# Patient Record
Sex: Male | Born: 1980 | Race: White | Hispanic: No | Marital: Married | Smoking: Former smoker
Health system: Southern US, Community
[De-identification: ages and names within clinical notes are randomized; demographics above are authoritative.]

## PROBLEM LIST (undated history)

## (undated) DIAGNOSIS — T7840XA Allergy, unspecified, initial encounter: Secondary | ICD-10-CM

## (undated) DIAGNOSIS — I10 Essential (primary) hypertension: Secondary | ICD-10-CM

## (undated) DIAGNOSIS — F319 Bipolar disorder, unspecified: Secondary | ICD-10-CM

## (undated) DIAGNOSIS — F419 Anxiety disorder, unspecified: Secondary | ICD-10-CM

## (undated) DIAGNOSIS — F32A Depression, unspecified: Secondary | ICD-10-CM

## (undated) DIAGNOSIS — K219 Gastro-esophageal reflux disease without esophagitis: Secondary | ICD-10-CM

## (undated) HISTORY — DX: Anxiety disorder, unspecified: F41.9

## (undated) HISTORY — DX: Allergy, unspecified, initial encounter: T78.40XA

## (undated) HISTORY — PX: CHOLECYSTECTOMY: SHX55

## (undated) HISTORY — DX: Depression, unspecified: F32.A

## (undated) HISTORY — DX: Gastro-esophageal reflux disease without esophagitis: K21.9

## (undated) HISTORY — PX: HERNIA REPAIR: SHX51

## (undated) HISTORY — PX: WISDOM TOOTH EXTRACTION: SHX21

## (undated) HISTORY — PX: TONSILLECTOMY: SUR1361

---

## 2010-07-25 ENCOUNTER — Emergency Department (HOSPITAL_BASED_OUTPATIENT_CLINIC_OR_DEPARTMENT_OTHER)
Admission: EM | Admit: 2010-07-25 | Discharge: 2010-07-25 | Payer: Self-pay | Source: Home / Self Care | Admitting: Emergency Medicine

## 2013-09-08 DIAGNOSIS — K8050 Calculus of bile duct without cholangitis or cholecystitis without obstruction: Secondary | ICD-10-CM | POA: Insufficient documentation

## 2014-02-25 DIAGNOSIS — K219 Gastro-esophageal reflux disease without esophagitis: Secondary | ICD-10-CM | POA: Insufficient documentation

## 2016-04-04 ENCOUNTER — Other Ambulatory Visit: Payer: Self-pay | Admitting: Cardiology

## 2019-01-18 DIAGNOSIS — B351 Tinea unguium: Secondary | ICD-10-CM | POA: Insufficient documentation

## 2019-01-18 DIAGNOSIS — E6609 Other obesity due to excess calories: Secondary | ICD-10-CM | POA: Insufficient documentation

## 2019-03-05 DIAGNOSIS — I10 Essential (primary) hypertension: Secondary | ICD-10-CM | POA: Insufficient documentation

## 2019-10-24 ENCOUNTER — Other Ambulatory Visit: Payer: Self-pay

## 2019-10-24 ENCOUNTER — Encounter (HOSPITAL_BASED_OUTPATIENT_CLINIC_OR_DEPARTMENT_OTHER): Payer: Self-pay | Admitting: Student

## 2019-10-24 ENCOUNTER — Emergency Department (HOSPITAL_BASED_OUTPATIENT_CLINIC_OR_DEPARTMENT_OTHER)
Admission: EM | Admit: 2019-10-24 | Discharge: 2019-10-24 | Disposition: A | Discharge status: Home / Self Care | Payer: 59 | Attending: Emergency Medicine | Admitting: Emergency Medicine

## 2019-10-24 DIAGNOSIS — Y93H2 Activity, gardening and landscaping: Secondary | ICD-10-CM | POA: Diagnosis not present

## 2019-10-24 DIAGNOSIS — I10 Essential (primary) hypertension: Secondary | ICD-10-CM | POA: Diagnosis not present

## 2019-10-24 DIAGNOSIS — Y92007 Garden or yard of unspecified non-institutional (private) residence as the place of occurrence of the external cause: Secondary | ICD-10-CM | POA: Insufficient documentation

## 2019-10-24 DIAGNOSIS — S0502XA Injury of conjunctiva and corneal abrasion without foreign body, left eye, initial encounter: Secondary | ICD-10-CM | POA: Insufficient documentation

## 2019-10-24 DIAGNOSIS — X58XXXA Exposure to other specified factors, initial encounter: Secondary | ICD-10-CM | POA: Diagnosis not present

## 2019-10-24 DIAGNOSIS — Y999 Unspecified external cause status: Secondary | ICD-10-CM | POA: Diagnosis not present

## 2019-10-24 DIAGNOSIS — S0592XA Unspecified injury of left eye and orbit, initial encounter: Secondary | ICD-10-CM | POA: Diagnosis present

## 2019-10-24 HISTORY — DX: Essential (primary) hypertension: I10

## 2019-10-24 MED ORDER — TETRACAINE HCL 0.5 % OP SOLN
2.0000 [drp] | Freq: Once | OPHTHALMIC | Status: AC
  Administered 2019-10-24: 15:00:00 2 [drp] via OPHTHALMIC

## 2019-10-24 MED ORDER — ERYTHROMYCIN 5 MG/GM OP OINT: TOPICAL_OINTMENT | OPHTHALMIC | 0 refills | Status: DC

## 2019-10-24 MED ORDER — FLUORESCEIN SODIUM 1 MG OP STRP
1.0000 | ORAL_STRIP | Freq: Once | OPHTHALMIC | Status: AC
  Administered 2019-10-24: 1 via OPHTHALMIC

## 2019-10-24 MED FILL — ERYTHROMYCIN EYE OINTMENT: 5 | 7 days supply | Qty: 4 | Fill #0

## 2019-10-24 NOTE — Discharge Instructions (Addendum)
You were seen in the ER today for problems with your left eye and found to have a corneal abrasion (please see attached handout). We are sending you home with erythromycin ointment (an antibiotic) to help cover for infection. Please use in the left eye 5 times per day for the next 7 days. Please follow up with ophthalmology within 2-3 days for re-evaluation. Return to the ER for new or worsening symptoms including but not limited to increased pain, blurry vision, loss of vision, redness/swelling around the eye, fever, pain with moving the eye, or any other concerns. Please also follow up with primary care within 1 month for recheck of your blood pressure as it was elevated in the ER today at 158/95.

## 2019-10-24 NOTE — ED Provider Notes (Signed)
[facility] EMERGENCY DEPARTMENT Provider Note   CSN: [id number] Arrival date & time: 10/24/19  1426     History Chief Complaint  Patient presents with  . Eye Pain    Jose Ramos is a 39 y.o. male with a history of hypertension who presents to the ED with complaints of L eye irritation. Patient states that 2 days prior he was doing some yardwork outside when he felt something fly into his left eye, he thinks this may have been grass, he blinked a few times and this seemed to resolve the problem. The subsequent day he noted some L eye redness and some crusting in the AM. Having some mild discomfort intermittently to the superolateral aspect of the L eye. No alleviating/aggravating factors. Not a contact lens wearer. Denies visual disturbance, eye swelling, fever, chills, nausea, or vomiting. Last tetanus 3-4 years ago.    HPI     Past Medical History:  Diagnosis Date  . Hypertension     There are no problems to display for this patient.   History reviewed. No pertinent surgical history.     History reviewed. No pertinent family history.  Social History   Tobacco Use  . Smoking status: Never Smoker  . Smokeless tobacco: Never Used  Substance Use Topics  . Alcohol use: Yes  . Drug use: Never    Home Medications Prior to Admission medications   Medication Sig Start Date End Date Taking? Authorizing Provider  erythromycin ophthalmic ointment Place a 1/2 inch ribbon of ointment into the lower left eyelid 5 times per day for the next 7 days. 10/24/19   Scarlett Portlock, Pleas Koch, PA-C    Allergies    Patient has no allergy information on record.  Review of Systems   Review of Systems  Constitutional: Negative for chills and fever.  HENT: Negative for congestion, ear pain and sore throat.   Eyes: Positive for pain, discharge and redness. Negative for photophobia, itching and visual disturbance.  Respiratory: Negative for shortness of breath.     Cardiovascular: Negative for chest pain.  Gastrointestinal: Negative for nausea and vomiting.    Physical Exam Updated Vital Signs BP (!) 158/95 (BP Location: Left Arm)   Pulse (!) 104   Temp 98.8 F (37.1 C) (Oral)   Resp 20   Ht 5\' 10"  (1.778 m)   Wt 106.6 kg   SpO2 100%   BMI 33.72 kg/m   Physical Exam Vitals and nursing note reviewed.  Constitutional:      General: He is not in acute distress.    Appearance: He is well-developed.  HENT:     Head: Normocephalic and atraumatic.     Nose: Nose normal.  Eyes:     General: Lids are normal. Lids are everted, no foreign bodies appreciated. Vision grossly intact. Gaze aligned appropriately.        Right eye: No discharge.        Left eye: No discharge.     Extraocular Movements: Extraocular movements intact.     Conjunctiva/sclera:     Right eye: Right conjunctiva is not injected. No chemosis, exudate or hemorrhage.    Left eye: Left conjunctiva is injected (lateral aspect ). No chemosis, exudate or hemorrhage.    Pupils: Pupils are equal, round, and reactive to light.     Comments: No periorbital erythema swelling or tenderness.  Visual acuity: Visual Acuity Bilateral Distance:20/20 R Distance:20/20 L Distance:20/25 2 mm area of fluorescein stain uptake and the lateral aspect of  the L eye consistent w/ corneal abrasion, no ulceration, no dendritic stain, negative seidel sign.   Neurological:     Mental Status: He is alert.     Comments: Clear speech.   Psychiatric:        Behavior: Behavior normal.        Thought Content: Thought content normal.     ED Results / Procedures / Treatments   Labs (all labs ordered are listed, but only abnormal results are displayed) Labs Reviewed - No data to display  EKG None  Radiology No results found.  Procedures Procedures (including critical care time)  Medications Ordered in ED Medications  fluorescein ophthalmic strip 1 strip (1 strip Left Eye Given 10/24/19 1441)   tetracaine (PONTOCAINE) 0.5 % ophthalmic solution 2 drop (2 drops Left Eye Given 10/24/19 1441)    ED Course  I have reviewed the triage vital signs and the nursing notes.  Pertinent labs & imaging results that were available during my care of the patient were reviewed by me and considered in my medical decision making (see chart for details).    MDM Rules/Calculators/A&P                      Patient presents to the ED with complaints of left eye irritation. Nontoxic, resting comfortably, vitals notable for elevated BP- history of same- doubt HTN emergency. Exam with mild conjunctival injection laterally with small lateral corneal abrasion noted. No ulceration noted or dendritic staining. No signs of globe rupture. Patient is afebrile and without proptosis, entrapment, or consensual photophobia, no periorbital swelling/erythema- doubt periorbital or orbital cellulitis. No significant visual acuity deficit. Tetanus is up to date. Will treat with erythromycin ophthalmic ointment as patient is not a contact lens wearer, ophthalmology follow up. I discussed treatment plan, need for follow-up, and return precautions with the patient. Provided opportunity for questions, patient confirmed understanding and is in agreement with plan. Findings and plan of care discussed with supervising physician Dr. Ronnald Nian who is in agreement.    Final Clinical Impression(s) / ED Diagnoses Final diagnoses:  Abrasion of left cornea, initial encounter    Rx / DC Orders ED Discharge Orders         Ordered    erythromycin ophthalmic ointment     10/24/19 [address], [place], PA-C 10/24/19 [place], [place], DO 10/25/19 1610

## 2019-10-24 NOTE — ED Triage Notes (Signed)
approx 2 days ago was doing yard work, something got into left eye. Eye now red, having HA

## 2021-01-21 DIAGNOSIS — F331 Major depressive disorder, recurrent, moderate: Secondary | ICD-10-CM | POA: Insufficient documentation

## 2021-03-01 DIAGNOSIS — F410 Panic disorder [episodic paroxysmal anxiety] without agoraphobia: Secondary | ICD-10-CM | POA: Insufficient documentation

## 2021-03-01 DIAGNOSIS — F332 Major depressive disorder, recurrent severe without psychotic features: Secondary | ICD-10-CM | POA: Insufficient documentation

## 2021-03-28 DIAGNOSIS — F3162 Bipolar disorder, current episode mixed, moderate: Secondary | ICD-10-CM | POA: Insufficient documentation

## 2021-03-30 DIAGNOSIS — Z91148 Patient's other noncompliance with medication regimen for other reason: Secondary | ICD-10-CM | POA: Insufficient documentation

## 2021-03-30 DIAGNOSIS — F99 Mental disorder, not otherwise specified: Secondary | ICD-10-CM | POA: Insufficient documentation

## 2021-03-30 DIAGNOSIS — F5105 Insomnia due to other mental disorder: Secondary | ICD-10-CM | POA: Insufficient documentation

## 2021-08-16 ENCOUNTER — Emergency Department (HOSPITAL_BASED_OUTPATIENT_CLINIC_OR_DEPARTMENT_OTHER): Payer: Commercial Managed Care - PPO

## 2021-08-16 ENCOUNTER — Encounter (HOSPITAL_BASED_OUTPATIENT_CLINIC_OR_DEPARTMENT_OTHER): Payer: Self-pay

## 2021-08-16 ENCOUNTER — Other Ambulatory Visit: Payer: Self-pay

## 2021-08-16 ENCOUNTER — Emergency Department (HOSPITAL_BASED_OUTPATIENT_CLINIC_OR_DEPARTMENT_OTHER)
Admission: EM | Admit: 2021-08-16 | Discharge: 2021-08-16 | Disposition: A | Discharge status: Home / Self Care | Payer: Commercial Managed Care - PPO | Attending: Emergency Medicine | Admitting: Emergency Medicine

## 2021-08-16 DIAGNOSIS — K5732 Diverticulitis of large intestine without perforation or abscess without bleeding: Secondary | ICD-10-CM | POA: Insufficient documentation

## 2021-08-16 DIAGNOSIS — K922 Gastrointestinal hemorrhage, unspecified: Secondary | ICD-10-CM | POA: Diagnosis not present

## 2021-08-16 DIAGNOSIS — R1031 Right lower quadrant pain: Secondary | ICD-10-CM

## 2021-08-16 DIAGNOSIS — I10 Essential (primary) hypertension: Secondary | ICD-10-CM | POA: Insufficient documentation

## 2021-08-16 DIAGNOSIS — K219 Gastro-esophageal reflux disease without esophagitis: Secondary | ICD-10-CM | POA: Diagnosis not present

## 2021-08-16 DIAGNOSIS — K5730 Diverticulosis of large intestine without perforation or abscess without bleeding: Secondary | ICD-10-CM

## 2021-08-16 HISTORY — DX: Bipolar disorder, unspecified: F31.9

## 2021-08-16 LAB — URINALYSIS, ROUTINE W REFLEX MICROSCOPIC
Bilirubin Urine: NEGATIVE
Glucose, UA: NEGATIVE mg/dL
Hgb urine dipstick: NEGATIVE
Ketones, ur: NEGATIVE mg/dL
Leukocytes,Ua: NEGATIVE
Nitrite: NEGATIVE
Protein, ur: NEGATIVE mg/dL
Specific Gravity, Urine: 1.025 (ref 1.005–1.030)
pH: 6 (ref 5.0–8.0)

## 2021-08-16 LAB — CBC WITH DIFFERENTIAL/PLATELET
Abs Immature Granulocytes: 0.02 10*3/uL (ref 0.00–0.07)
Basophils Absolute: 0.1 10*3/uL (ref 0.0–0.1)
Basophils Relative: 1 %
Eosinophils Absolute: 0.4 10*3/uL (ref 0.0–0.5)
Eosinophils Relative: 7 %
HCT: 48.1 % (ref 39.0–52.0)
Hemoglobin: 16.5 g/dL (ref 13.0–17.0)
Immature Granulocytes: 0 %
Lymphocytes Relative: 27 %
Lymphs Abs: 1.7 10*3/uL (ref 0.7–4.0)
MCH: 28.2 pg (ref 26.0–34.0)
MCHC: 34.3 g/dL (ref 30.0–36.0)
MCV: 82.2 fL (ref 80.0–100.0)
Monocytes Absolute: 0.4 10*3/uL (ref 0.1–1.0)
Monocytes Relative: 7 %
Neutro Abs: 3.6 10*3/uL (ref 1.7–7.7)
Neutrophils Relative %: 58 %
Platelets: 236 10*3/uL (ref 150–400)
RBC: 5.85 MIL/uL — ABNORMAL HIGH (ref 4.22–5.81)
RDW: 13.6 % (ref 11.5–15.5)
WBC: 6.3 10*3/uL (ref 4.0–10.5)
nRBC: 0 % (ref 0.0–0.2)

## 2021-08-16 LAB — COMPREHENSIVE METABOLIC PANEL
ALT: 19 U/L (ref 0–44)
AST: 16 U/L (ref 15–41)
Albumin: 4.3 g/dL (ref 3.5–5.0)
Alkaline Phosphatase: 51 U/L (ref 38–126)
Anion gap: 8 (ref 5–15)
BUN: 18 mg/dL (ref 6–20)
CO2: 25 mmol/L (ref 22–32)
Calcium: 9.1 mg/dL (ref 8.9–10.3)
Chloride: 105 mmol/L (ref 98–111)
Creatinine, Ser: 1.3 mg/dL — ABNORMAL HIGH (ref 0.61–1.24)
GFR, Estimated: >60 mL/min (ref >60)
Glucose, Bld: 101 mg/dL — ABNORMAL HIGH (ref 70–99)
Potassium: 4.3 mmol/L (ref 3.5–5.1)
Sodium: 138 mmol/L (ref 135–145)
Total Bilirubin: 1.1 mg/dL (ref 0.3–1.2)
Total Protein: 7.5 g/dL (ref 6.5–8.1)

## 2021-08-16 LAB — OCCULT BLOOD X 1 CARD TO LAB, STOOL: Fecal Occult Bld: POSITIVE — AB

## 2021-08-16 LAB — LIPASE, BLOOD: Lipase: 141 U/L — ABNORMAL HIGH (ref 11–51)

## 2021-08-16 MED ORDER — SODIUM CHLORIDE 0.9 % IV BOLUS
1000.0000 mL | Freq: Once | INTRAVENOUS | Status: AC
  Administered 2021-08-16: 1000 mL via INTRAVENOUS

## 2021-08-16 MED ORDER — HYDROCORTISONE (PERIANAL) 2.5 % EX CREA: 1.0000 "application " | TOPICAL_CREAM | Freq: Two times a day (BID) | CUTANEOUS | 0 refills | Status: DC

## 2021-08-16 MED ORDER — IOHEXOL 350 MG/ML SOLN
100.0000 mL | Freq: Once | INTRAVENOUS | Status: AC | PRN
  Administered 2021-08-16: 100 mL via INTRAVENOUS

## 2021-08-16 NOTE — ED Triage Notes (Addendum)
Pt reports re blood in stool this morning x 2. Right sided lower abdominal pain. Occurred x 1 2 weeks ago and pt reports an Increase in reflux

## 2021-08-16 NOTE — ED Provider Notes (Signed)
[facility] Provider Note   CSN: [id number] Arrival date & time: 08/16/21  [id number]     History  Chief Complaint  Patient presents with   Blood In Stools    Jose Ramos is a 41 y.o. male.  Pt is a 41 yo wm with a hx of GERD, htn, and bipolar d/o.  Pt said he woke up this am and had "what looks like a miscarriage in his toilet."  He said he had 2 episodes of bloody stools this am.  Pt had some bloody stool a few weeks ago and saw his pcp.  He said they were not sure what it was from, but it looks like a CT abd/pelvis was ordered, but he did not get it done.  Pt has some associated RLQ pain which he's had for about a month intermittently.  He denies constipation.  He said he has several bowel movements a day since he had his gallbladder removed a few years ago.  Pt denies any pain with bm.      Home Medications Prior to Admission medications   Medication Sig Start Date End Date Taking? Authorizing Provider  hydrocortisone (ANUSOL-HC) 2.5 % rectal cream Place 1 application rectally 2 (two) times daily. 08/16/21  Yes Isla Pence, MD  erythromycin ophthalmic ointment Place a 1/2 inch ribbon of ointment into the lower left eyelid 5 times per day for the next 7 days. 10/24/19   Petrucelli, Glynda Jaeger, PA-C      Allergies    Patient has no known allergies.    Review of Systems   Review of Systems  Gastrointestinal:  Positive for abdominal pain and blood in stool.  All other systems reviewed and are negative.  Physical Exam Updated Vital Signs BP (!) 150/101    Pulse (!) 56    Temp 98.3 F (36.8 C)    Resp 18    Ht 5\' 9"  (1.753 m)    Wt 108.9 kg    SpO2 100%    BMI 35.44 kg/m  Physical Exam Vitals and nursing note reviewed. Exam conducted with a chaperone present.  Constitutional:      Appearance: Normal appearance.  HENT:     Head: Normocephalic and atraumatic.     Right Ear: External ear normal.     Left Ear: External ear normal.     Nose:  Nose normal.     Mouth/Throat:     Mouth: Mucous membranes are moist.     Pharynx: Oropharynx is clear.  Eyes:     Extraocular Movements: Extraocular movements intact.     Conjunctiva/sclera: Conjunctivae normal.     Pupils: Pupils are equal, round, and reactive to light.  Cardiovascular:     Rate and Rhythm: Normal rate and regular rhythm.     Pulses: Normal pulses.     Heart sounds: Normal heart sounds.  Pulmonary:     Effort: Pulmonary effort is normal.     Breath sounds: Normal breath sounds.  Abdominal:     General: Abdomen is flat. Bowel sounds are normal.     Palpations: Abdomen is soft.     Tenderness: There is abdominal tenderness in the right lower quadrant.  Genitourinary:    Rectum: Guaiac result positive. No external hemorrhoid.  Musculoskeletal:        General: Normal range of motion.     Cervical back: Normal range of motion and neck supple.  Skin:    General: Skin is warm.  Capillary Refill: Capillary refill takes less than 2 seconds.  Neurological:     General: No focal deficit present.     Mental Status: He is alert and oriented to person, place, and time.  Psychiatric:        Mood and Affect: Mood normal.        Behavior: Behavior normal.        Thought Content: Thought content normal.        Judgment: Judgment normal.    ED Results / Procedures / Treatments   Labs (all labs ordered are listed, but only abnormal results are displayed) Labs Reviewed  CBC WITH DIFFERENTIAL/PLATELET - Abnormal; Notable for the following components:      Result Value   RBC 5.85 (*)    All other components within normal limits  COMPREHENSIVE METABOLIC PANEL - Abnormal; Notable for the following components:   Glucose, Bld 101 (*)    Creatinine, Ser 1.30 (*)    All other components within normal limits  LIPASE, BLOOD - Abnormal; Notable for the following components:   Lipase 141 (*)    All other components within normal limits  OCCULT BLOOD X 1 CARD TO LAB, STOOL -  Abnormal; Notable for the following components:   Fecal Occult Bld POSITIVE (*)    All other components within normal limits  URINALYSIS, ROUTINE W REFLEX MICROSCOPIC    EKG None  Radiology CT Angio Abd/Pel W and/or Wo Contrast  Result Date: 08/16/2021 CLINICAL DATA:  Lower GI bleeding, right lower quadrant pain EXAM: CTA ABDOMEN AND PELVIS WITHOUT AND WITH CONTRAST TECHNIQUE: Multidetector CT imaging of the abdomen and pelvis was performed using the standard protocol during bolus administration of intravenous contrast. Multiplanar reconstructed images and MIPs were obtained and reviewed to evaluate the vascular anatomy. RADIATION DOSE REDUCTION: This exam was performed according to the departmental dose-optimization program which includes automated exposure control, adjustment of the mA and/or kV according to patient size and/or use of iterative reconstruction technique. CONTRAST:  132mL OMNIPAQUE IOHEXOL 350 MG/ML SOLN COMPARISON:  None. FINDINGS: VASCULAR Aorta: Normal caliber aorta without aneurysm, dissection, vasculitis or significant stenosis. Celiac: Patent without evidence of aneurysm, dissection, vasculitis or significant stenosis. SMA: Patent without evidence of aneurysm, dissection, vasculitis or significant stenosis. Renals: Duplicated right, superior dominant, both patent. Duplicated left, Co-dominant, both patent. IMA: Patent without evidence of aneurysm, dissection, vasculitis or significant stenosis. Inflow: Patent without evidence of aneurysm, dissection, vasculitis or significant stenosis. Proximal Outflow: Bilateral common femoral and visualized portions of the superficial and profunda femoral arteries are patent without evidence of aneurysm, dissection, vasculitis or significant stenosis. Veins: No obvious venous abnormality within the limitations of this arterial phase study. Review of the MIP images confirms the above findings. NON-VASCULAR Lower chest: No pleural or pericardial  effusion. Visualized lung bases clear. Hepatobiliary: No focal liver abnormality is seen. Status post cholecystectomy. No biliary dilatation. Pancreas: Unremarkable. No pancreatic ductal dilatation or surrounding inflammatory changes. Spleen: Normal in size without focal abnormality. Adrenals/Urinary Tract: Adrenal glands are unremarkable. Kidneys are normal, without renal calculi, focal lesion, or hydronephrosis. Bladder is unremarkable. Stomach/Bowel: No active extravasation into the lumen identified. Stomach is nondistended, unremarkable. Small bowel decompressed. Normal appendix. The colon is nondilated. A few scattered diverticula from the splenic flexure and proximal descending segment, without adjacent inflammatory change. Lymphatic: No abdominal or pelvic adenopathy. Reproductive: Prostate is unremarkable. Other: No ascites.  No free air. Musculoskeletal: Early degenerative disc disease L5-S1. No fracture or worrisome bone lesion. IMPRESSION: 1. No  active extravasation into the bowel lumen. 2. Diverticulosis involving splenic flexure and proximal descending colon. Electronically Signed   By: Lucrezia Europe M.D.   On: 08/16/2021 10:33    Procedures Procedures    Medications Ordered in ED Medications  sodium chloride 0.9 % bolus 1,000 mL (0 mLs Intravenous Stopped 08/16/21 0929)  iohexol (OMNIPAQUE) 350 MG/ML injection 100 mL (100 mLs Intravenous Contrast Given 08/16/21 0900)    ED Course/ Medical Decision Making/ A&P                           Medical Decision Making Amount and/or Complexity of Data Reviewed Labs: ordered. Radiology: ordered.  Risk Prescription drug management.   Pt does have guaiac + stool.  Due to the amount of blood described, pt had labs and a CTA done.  Labs show nothing acute.  Hgb is 16.5.  CTA shows some diverticulosis, but no active extravasation into the bowel.  Bleeding may be due to an internal hemorrhoid.  Pt started on anusol hc and referred to GI.  Pt  eats a very low fiber diet.  He is encouraged to increase the fiber and fruits/vegetables in his diet.  Pt has not had any further bleeding here.  He is not on blood thinners.  He is stable for d/c.  Return if worse.  F/u with pcp.  Plan d/w pt and his wife at bedside and they are in agreement.         Final Clinical Impression(s) / ED Diagnoses Final diagnoses:  Lower GI bleed  Right lower quadrant abdominal pain  Diverticulosis of colon without hemorrhage    Rx / DC Orders ED Discharge Orders          Ordered    Ambulatory referral to Gastroenterology        08/16/21 1103    hydrocortisone (ANUSOL-HC) 2.5 % rectal cream  2 times daily        08/16/21 1103              Isla Pence, MD 08/16/21 1109

## 2021-09-13 ENCOUNTER — Encounter: Payer: Self-pay | Admitting: Emergency Medicine

## 2021-10-27 ENCOUNTER — Encounter: Payer: Self-pay | Admitting: Gastroenterology

## 2021-11-05 ENCOUNTER — Encounter: Payer: Self-pay | Admitting: Gastroenterology

## 2021-11-05 ENCOUNTER — Ambulatory Visit (INDEPENDENT_AMBULATORY_CARE_PROVIDER_SITE_OTHER): Payer: Commercial Managed Care - PPO | Admitting: Gastroenterology

## 2021-11-05 VITALS — BP 116/74 | HR 80 | Ht 69.0 in | Wt 244.8 lb

## 2021-11-05 DIAGNOSIS — K921 Melena: Secondary | ICD-10-CM | POA: Diagnosis not present

## 2021-11-05 DIAGNOSIS — R112 Nausea with vomiting, unspecified: Secondary | ICD-10-CM | POA: Diagnosis not present

## 2021-11-05 DIAGNOSIS — R634 Abnormal weight loss: Secondary | ICD-10-CM | POA: Diagnosis not present

## 2021-11-05 DIAGNOSIS — R1030 Lower abdominal pain, unspecified: Secondary | ICD-10-CM | POA: Diagnosis not present

## 2021-11-05 MED ORDER — ONDANSETRON HCL 4 MG PO TABS: 4.0000 mg | ORAL_TABLET | Freq: Four times a day (QID) | ORAL | 0 refills | Status: AC | PRN

## 2021-11-05 NOTE — Patient Instructions (Signed)
You have been scheduled for an endoscopy. Please follow written instructions given to you at your visit today. ?If you use inhalers (even only as needed), please bring them with you on the day of your procedure. ? ?We have sent the following medications to your pharmacy for you to pick up at your convenience: ?Zofran 4 mg every 6 hours as needed for nausea/vomiting ? ?If you are age 42 or older, your body mass index should be between 23-30. Your Body mass index is 36.15 kg/m?Marland Kitchen If this is out of the aforementioned range listed, please consider follow up with your Primary Care Provider. ? ?If you are age 31 or younger, your body mass index should be between 19-25. Your Body mass index is 36.15 kg/m?Marland Kitchen If this is out of the aformentioned range listed, please consider follow up with your Primary Care Provider.  ? ?________________________________________________________ ? ?The [place] [facility] providers would like to encourage you to use [facility] to communicate with providers for non-urgent requests or questions.  Due to long hold times on the telephone, sending your provider a message by [facility] may be a faster and more efficient way to get a response.  Please allow 48 business hours for a response.  Please remember that this is for non-urgent requests.  ?_______________________________________________________ ? ?Due to recent changes in healthcare laws, you may see the results of your imaging and laboratory studies on MyChart before your provider has had a chance to review them.  We understand that in some cases there may be results that are confusing or concerning to you. Not all laboratory results come back in the same time frame and the provider may be waiting for multiple results in order to interpret others.  Please give [place] 48 hours in order for your provider to thoroughly review all the results before contacting the office for clarification of your results.  ? ?

## 2021-11-05 NOTE — Progress Notes (Signed)
? ? ?[facility] Consult Note: ? ?History: ?Jose Ramos ?11/05/2021 ? ?Referring provider: Sherrine Maples, MD ? ?Reason for consult/chief complaint: Diverticulitis [facility] visit. ) and Emesis (Unable to keep food down, vomiting about 60% of the time. Admits to weight loss) ? ? ?Subjective  ?HPI: ? ?This is a very pleasant 41 year old man referred by primary care and recent ED physicians for multiple digestive symptoms ? ?He was seen in the [place] ED on January 23 with hematochezia that may have been diverticular in origin.  CTA showed diverticulosis and no extravasation. ? ?He was seen at the [place] ED in [facility] on April 6 with nausea vomiting abdominal pain as high as an 8 out of 10 with difficulty maintaining his oral intake. ? ?[name] tells me that for many years he has had reflux symptoms remittent dysphagia and might have previously seen a GI physician.  He tried multiple PPIs and his current one seems to work the best.  However he still has breakthrough symptoms even just drinking water at times.  He has primarily pyrosis and regurgitation with belching.  He is feeling much worse in the last 2 weeks with increased belching and vomiting and only able to keep down about half of what he eats and drinks.  He was having lower abdominal pain during the ED visit last week but says that is improved.  There have been no more bleeding episodes since the 1 that brought him to the ED in January.  He has been down about 10 pounds in the last month due to decreased oral intake. ? ?ROS: ? ?Review of Systems  ?Constitutional:  Positive for unexpected weight change. Negative for appetite change.  ?HENT:  Negative for mouth sores and voice change.   ?Eyes:  Negative for pain and redness.  ?Respiratory:  Negative for cough and shortness of breath.   ?Cardiovascular:  Negative for chest pain and palpitations.  ?Genitourinary:  Negative for dysuria and  hematuria.  ?Musculoskeletal:  Negative for arthralgias and myalgias.  ?Skin:  Negative for pallor and rash.  ?Neurological:  Negative for weakness and headaches.  ?Hematological:  Negative for adenopathy.  ?Psychiatric/Behavioral:    ?     Mood lately stable.  ? ? ?Past Medical History: ?Past Medical History:  ?Diagnosis Date  ? Bipolar 1 disorder ([place])   ? Hypertension   ? ? ? ?Past Surgical History: ?Past Surgical History:  ?Procedure Laterality Date  ? CHOLECYSTECTOMY    ? HERNIA REPAIR    ? TONSILLECTOMY    ? WISDOM TOOTH EXTRACTION    ? ? ? ?Family History: ?Family History  ?Problem Relation Age of Onset  ? Kidney disease Mother   ? Hypertension Mother   ? GER disease Mother   ? Clotting disorder Mother   ? Colon cancer Neg Hx   ? Colon polyps Neg Hx   ? ? ?Social History: ?Social History  ? ?Socioeconomic History  ? Marital status: Married  ?  Spouse name: Not on file  ? Number of children: Not on file  ? Years of education: Not on file  ? Highest education level: Not on file  ?Occupational History  ? Not on file  ?Tobacco Use  ? Smoking status: Former  ?  Types: Cigarettes  ? Smokeless tobacco: Never  ?Vaping Use  ? Vaping Use: Some days  ?Substance and Sexual Activity  ? Alcohol use: Yes  ?  Comment: social  ? Drug use: Never  ?  Sexual activity: Yes  ?Other Topics Concern  ? Not on file  ?Social History Narrative  ? Not on file  ? ?Social Determinants of Health  ? ?Financial Resource Strain: Not on file  ?Food Insecurity: Not on file  ?Transportation Needs: Not on file  ?Physical Activity: Not on file  ?Stress: Not on file  ?Social Connections: Not on file  ? ? ?Allergies: ?Allergies  ?Allergen Reactions  ? Lisinopril Swelling  ?  Other reaction(s): Angioedema (ALLERGY/intolerance)  ? ? ?Outpatient Meds: ?Current Outpatient Medications  ?Medication Sig Dispense Refill  ? citalopram (CELEXA) 40 MG tablet Take 40 mg by mouth daily.    ? OLANZapine (ZYPREXA) 7.5 MG tablet Take 7.5 mg by mouth at bedtime.    ?  ondansetron (ZOFRAN) 4 MG tablet Take 1 tablet (4 mg total) by mouth every 6 (six) hours as needed for nausea or vomiting. 60 tablet 0  ? pantoprazole (PROTONIX) 20 MG tablet Take 20-40 mg by mouth daily.    ? ?No current facility-administered medications for this visit.  ? ? ? ? ?___________________________________________________________________ ?Objective  ? ?Exam: ? ?BP 116/74   Pulse 80   Ht 5\' 9"  (1.753 m)   Wt 244 lb 12.8 oz (111 kg)   BMI 36.15 kg/m?  ?Wt Readings from Last 3 Encounters:  ?11/05/21 244 lb 12.8 oz (111 kg)  ?08/16/21 240 lb (108.9 kg)  ?10/24/19 235 lb (106.6 kg)  ? ? ?General: Not acutely ill-appearing, good muscle mass, appears well-hydrated. ?Eyes: sclera anicteric, no redness ?ENT: oral mucosa moist without lesions, no cervical or supraclavicular lymphadenopathy ?CV: RRR without murmur, S1/S2, no JVD, no peripheral edema ?Resp: clear to auscultation bilaterally, normal RR and effort noted ?GI: soft, mild midline lower tenderness without guarding, with active bowel sounds. No guarding or palpable organomegaly noted. ?Skin; warm and dry, no rash or jaundice noted ?Neuro: awake, alert and oriented x 3. Normal gross motor function and fluent speech ? ?Labs: ? ? ?  Latest Ref Rng & Units 08/16/2021  ?  7:52 AM  ?CBC  ?WBC 4.0 - 10.5 K/uL 6.3    ?Hemoglobin 13.0 - 17.0 g/dL 16.5    ?Hematocrit 39.0 - 52.0 % 48.1    ?Platelets 150 - 400 K/uL 236    ? ? ?  Latest Ref Rng & Units 08/16/2021  ?  7:52 AM  ?CMP  ?Glucose 70 - 99 mg/dL 101    ?BUN 6 - 20 mg/dL 18    ?Creatinine 0.61 - 1.24 mg/dL 1.30    ?Sodium 135 - 145 mmol/L 138    ?Potassium 3.5 - 5.1 mmol/L 4.3    ?Chloride 98 - 111 mmol/L 105    ?CO2 22 - 32 mmol/L 25    ?Calcium 8.9 - 10.3 mg/dL 9.1    ?Total Protein 6.5 - 8.1 g/dL 7.5    ?Total Bilirubin 0.3 - 1.2 mg/dL 1.1    ?Alkaline Phos 38 - 126 U/L 51    ?AST 15 - 41 U/L 16    ?ALT 0 - 44 U/L 19    ? ?On 10/28/2021 in the ED: ? ?WBC 8.3, hemoglobin 17.2, platelet 234 ?Lipase 36 ?BMP  normal except creatinine 1.29 with BUN 10 ?Albumin 4.3 ?Total bilirubin 1.3, alkaline phosphatase 56, AST 14, ALT 16 ? ? ?Radiologic Studies: ? ?CT ABDOMEN AND PELVIS WITH CONTRAST ? ?TECHNIQUE: ?Multidetector CT imaging of the abdomen and pelvis was performed ?using the standard protocol following bolus administration of ?intravenous contrast. ? ?RADIATION DOSE  REDUCTION: This exam was performed according to the ?departmental dose-optimization program which includes automated ?exposure control, adjustment of the mA and/or kV according to ?patient size and/or use of iterative reconstruction technique. ? ?CONTRAST: 100 mL Omnipaque 350 iodinated contrast IV ? ?COMPARISON: None. ? ?FINDINGS: ?Lower chest: No acute abnormality. ? ?Hepatobiliary: No focal liver abnormality is seen. Status post ?cholecystectomy. No biliary dilatation. ? ?Pancreas: Unremarkable. No pancreatic ductal dilatation or ?surrounding inflammatory changes. ? ?Spleen: Normal in size without significant abnormality. ? ?Adrenals/Urinary Tract: Adrenal glands are unremarkable. Kidneys are ?normal, without renal calculi, solid lesion, or hydronephrosis. ?Bladder is unremarkable. ? ?Stomach/Bowel: Stomach is within normal limits. Appendix appears ?diminutive although normal. No evidence of bowel wall thickening, ?distention, or inflammatory changes. ? ?Vascular/Lymphatic: No significant vascular findings are present. No ?enlarged abdominal or pelvic lymph nodes. ? ?Reproductive: No mass or other significant abnormality. ? ?Other: Small fat containing bilateral inguinal hernias. No ascites. ? ?Musculoskeletal: No acute or significant osseous findings. ? ?IMPRESSION: ?1. No acute CT findings of the abdomen or pelvis to explain ?abdominal pain, nausea, or vomiting. In particular, no evidence of ?acute diverticulitis nor significant diverticular disease of the ?colon. ?2. Status post cholecystectomy. ? ?Electronically Signed ?By: Delanna Ahmadi M.D. ?On:  10/28/2021 10:06  ?__________________________________________ ? ?CLINICAL DATA:  Lower GI bleeding, right lower quadrant pain ?  ?EXAM: ?CTA ABDOMEN AND PELVIS WITHOUT AND WITH CONTRAST ?  ?TECHNIQUE: ?Multidetector CT

## 2021-11-08 ENCOUNTER — Telehealth: Payer: Self-pay | Admitting: Gastroenterology

## 2021-11-08 NOTE — Telephone Encounter (Signed)
Thank you for the note. ? ?I am concerned about his symptoms, so if he can arrange another care partner sooner than May 17th, then let us know and we may be able to find a sooner slot. ? ?- HD ?

## 2021-11-08 NOTE — Telephone Encounter (Signed)
Patient spouse called to reschedule EGD scheduled for 11/09/21 to 12/08/21. Per spouse, husband did not notify wife of procedure. Unable to take him.  ?

## 2021-11-08 NOTE — Telephone Encounter (Signed)
Patient called in. He was unaware his wife cancelled his appt. He wanted to keep it for 4/18. I rescheduled him for 4/18 again. ?

## 2021-11-09 ENCOUNTER — Encounter: Payer: Self-pay | Admitting: Gastroenterology

## 2021-11-09 ENCOUNTER — Encounter: Payer: Commercial Managed Care - PPO | Admitting: Gastroenterology

## 2021-11-09 ENCOUNTER — Ambulatory Visit (AMBULATORY_SURGERY_CENTER): Payer: Commercial Managed Care - PPO | Admitting: Gastroenterology

## 2021-11-09 VITALS — BP 106/69 | HR 61 | Temp 98.6°F | Resp 21 | Ht 69.0 in | Wt 244.0 lb

## 2021-11-09 DIAGNOSIS — K209 Esophagitis, unspecified without bleeding: Secondary | ICD-10-CM

## 2021-11-09 DIAGNOSIS — R1319 Other dysphagia: Secondary | ICD-10-CM | POA: Diagnosis not present

## 2021-11-09 DIAGNOSIS — R12 Heartburn: Secondary | ICD-10-CM

## 2021-11-09 DIAGNOSIS — K200 Eosinophilic esophagitis: Secondary | ICD-10-CM

## 2021-11-09 DIAGNOSIS — R634 Abnormal weight loss: Secondary | ICD-10-CM

## 2021-11-09 DIAGNOSIS — R112 Nausea with vomiting, unspecified: Secondary | ICD-10-CM | POA: Diagnosis not present

## 2021-11-09 DIAGNOSIS — K3189 Other diseases of stomach and duodenum: Secondary | ICD-10-CM

## 2021-11-09 DIAGNOSIS — K317 Polyp of stomach and duodenum: Secondary | ICD-10-CM

## 2021-11-09 HISTORY — PX: UPPER GASTROINTESTINAL ENDOSCOPY: SHX188

## 2021-11-09 MED ORDER — SODIUM CHLORIDE 0.9 % IV SOLN: 500.0000 mL | Freq: Once | INTRAVENOUS | Status: DC

## 2021-11-09 NOTE — Op Note (Signed)
[facility] ?Patient Name: Jose Ramos ?Procedure Date: 11/09/2021 1:34 PM ?MRN: [id number] ?Endoscopist: Starr Lake. Myrtie Neither , MD ?Age: 41 ?Referring MD:  ?Date of Birth: 10-13-1980 ?Gender: Male ?Account #: [id number] ?Procedure:                Upper GI endoscopy ?Indications:              Upper abdominal pain, Esophageal dysphagia,  ?                          Heartburn, Nausea with vomiting, Weight loss ?                          11/05/21 GI office consult note with clinical  ?                          details. No cause seen on recent CTAP during ED  ?                          visit ?Medicines:                Monitored Anesthesia Care ?Procedure:                Pre-Anesthesia Assessment: ?                          - Prior to the procedure, a History and Physical  ?                          was performed, and patient medications and  ?                          allergies were reviewed. The patient's tolerance of  ?                          previous anesthesia was also reviewed. The risks  ?                          and benefits of the procedure and the sedation  ?                          options and risks were discussed with the patient.  ?                          All questions were answered, and informed consent  ?                          was obtained. Prior Anticoagulants: The patient has  ?                          taken no previous anticoagulant or antiplatelet  ?                          agents. ASA Grade Assessment: II - A patient with  ?  mild systemic disease. After reviewing the risks  ?                          and benefits, the patient was deemed in  ?                          satisfactory condition to undergo the procedure. ?                          After obtaining informed consent, the endoscope was  ?                          passed under direct vision. Throughout the  ?                          procedure, the patient's blood pressure, pulse, and  ?                           oxygen saturations were monitored continuously. The  ?                          Endoscope was introduced through the mouth, and  ?                          advanced to the second part of duodenum. The upper  ?                          GI endoscopy was accomplished without difficulty.  ?                          The patient tolerated the procedure. ?Scope In: ?Scope Out: ?Findings:                 The larynx was normal. ?                          Diffuse mucosal changes characterized by concentric  ?                          rings, longitudinal markings and punctated white  ?                          spots were found in the entire esophagus. Several  ?                          biopsies were obtained in the middle third of the  ?                          esophagus and in the lower third of the esophagus  ?                          with cold forceps for evaluation of eosinophilic  ?  esophagitis. The entire lumen was a narrow caliber,  ?                          approximately 12mm diameter. The EGJ was more  ?                          focally narrowed at 10-3411mm diameter. ?                          Esophageal dilation was not performed (cough,  ?                          hiccup, myoclonic jerks) ?                          A 2 cm hiatal hernia was present. ?                          The stomach was normal. Several biopsies were  ?                          obtained on the greater curvature of the gastric  ?                          body, on the lesser curvature of the gastric body,  ?                          on the greater curvature of the gastric antrum and  ?                          on the lesser curvature of the gastric antrum with  ?                          cold forceps for histology. (r/o H pylori and  ?                          eosinophilic gastritis) ?                          The cardia and gastric fundus were normal on  ?                          retroflexion. ?                           A single small sessile polyp was found in the  ?                          duodenal bulb. The polyp was removed with a  ?                          piecemeal technique using a cold biopsy forceps.  ?  Resection and retrieval were complete. ?                          Otherwise normal mucosa was found in the entire  ?                          duodenum. Biopsies for histology were taken from  ?                          the second portion and sweep with a cold forceps  ?                          for evaluation of celiac disease and eosinophilic  ?                          duodenitis. ?Complications:            No immediate complications. ?Estimated Blood Loss:     Estimated blood loss was minimal. ?Impression:               - Normal larynx. ?                          - Longitudinally marked, punctate white spotted  ?                          mucosa in the esophagus. ?                          - 2 cm hiatal hernia. ?                          - Normal stomach. ?                          - A single duodenal polyp. Resected and retrieved. ?                          - Normal mucosa was found in the entire examined  ?                          duodenum. Biopsied. ?                          - Several biopsies were obtained in the middle  ?                          third of the esophagus and in the lower third of  ?                          the esophagus. ?                          - Several biopsies were obtained on the greater  ?                          curvature of  the gastric body, on the lesser  ?                          curvature of the gastric body, on the greater  ?                          curvature of the gastric antrum and on the lesser  ?                          curvature of the gastric antrum. ?                          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                          heartburn and dysphagia.  However, no clear cause  ?                          seen for persistent vomiting. ?Recommendation:           - Patient has a contact number available for  ?                          emergencies. The signs and symptoms of potential  ?                          delayed complications were discussed with the  ?                          patient. Return to normal activities tomorrow.  ?                          Written discharge instructions were provided to the  ?                          patient. ?                          - Resume previous diet. ?                          - Continue present medications, including  ?                          pantoprazole and ondansetron. ?                          - Await pathology results. Clinic follow up will;  ?                          be arranged. ?Shreeya Recendiz L. Myrtie Neither, MD ?11/09/2021 2:02:14 PM ?This report has been signed electronically. ?

## 2021-11-09 NOTE — Progress Notes (Signed)
VSS, transported to PACU °

## 2021-11-09 NOTE — Patient Instructions (Signed)
YOU HAD AN ENDOSCOPIC PROCEDURE TODAY AT THE [facility]:   Refer to the procedure report that was given to you for any specific questions about what was found during the examination.  If the procedure report does not answer your questions, please call your gastroenterologist to clarify.  If you requested that your care partner not be given the details of your procedure findings, then the procedure report has been included in a sealed envelope for you to review at your convenience later.  YOU SHOULD EXPECT: Some feelings of bloating in the abdomen. Passage of more gas than usual.  Walking can help get rid of the air that was put into your GI tract during the procedure and reduce the bloating. If you had a lower endoscopy (such as a colonoscopy or flexible sigmoidoscopy) you may notice spotting of blood in your stool or on the toilet paper. If you underwent a bowel prep for your procedure, you may not have a normal bowel movement for a few days.  Please Note:  You might notice some irritation and congestion in your nose or some drainage.  This is from the oxygen used during your procedure.  There is no need for concern and it should clear up in a day or so.  SYMPTOMS TO REPORT IMMEDIATELY:    Following upper endoscopy (EGD)  Vomiting of blood or coffee ground material  New chest pain or pain under the shoulder blades  Painful or persistently difficult swallowing  New shortness of breath  Fever of 100F or higher  Black, tarry-looking stools  For urgent or emergent issues, a gastroenterologist can be reached at any hour by calling [phone]. Do not use MyChart messaging for urgent concerns.    DIET:  We do recommend a small meal at first, but then you may proceed to your regular diet.  Drink plenty of fluids but you should avoid alcoholic beverages for 24 hours.  ACTIVITY:  You should plan to take it easy for the rest of today and you should NOT DRIVE or use heavy machinery  until tomorrow (because of the sedation medicines used during the test).    FOLLOW UP: Our staff will call the number listed on your records 48-72 hours following your procedure to check on you and address any questions or concerns that you may have regarding the information given to you following your procedure. If we do not reach you, we will leave a message.  We will attempt to reach you two times.  During this call, we will ask if you have developed any symptoms of COVID 19. If you develop any symptoms (ie: fever, flu-like symptoms, shortness of breath, cough etc.) before then, please call [phone].  If you test positive for Covid 19 in the 2 weeks post procedure, please call and report this information to us.    If any biopsies were taken you will be contacted by phone or by letter within the next 1-3 weeks.  Please call us at [phone] if you have not heard about the biopsies in 3 weeks.    SIGNATURES/CONFIDENTIALITY: You and/or your care partner have signed paperwork which will be entered into your electronic medical record.  These signatures attest to the fact that that the information above on your After Visit Summary has been reviewed and is understood.  Full responsibility of the confidentiality of this discharge information lies with you and/or your care-partner. 

## 2021-11-09 NOTE — Progress Notes (Signed)
Pt's states no medical or surgical changes since previsit or office visit. 

## 2021-11-09 NOTE — Progress Notes (Signed)
No changes to clinical history since GI office visit on 11/05/21. ? ?The patient is appropriate for an endoscopic procedure in the ambulatory setting. ? ?- Amada Jupiter, MD ? ? ? ?

## 2021-11-09 NOTE — Progress Notes (Signed)
Called to room to assist during endoscopic procedure.  Patient ID and intended procedure confirmed with present staff. Received instructions for my participation in the procedure from the performing physician.  

## 2021-11-10 ENCOUNTER — Telehealth: Payer: Self-pay

## 2021-11-10 NOTE — Telephone Encounter (Signed)
Per 11/09/21 procedure report - Clinic follow up will be arranged. ? ?Called and spoke with patient. He has been scheduled for a f/u appt with Dr. Myrtie Neither on Wednesday, 12/22/21 at 3 pm. Pt is aware that I will mail a copy of his appt information to him, he confirmed address on file. Pt verbalized understanding and had no concerns at the end of the call. ?

## 2021-11-11 ENCOUNTER — Telehealth: Payer: Self-pay | Admitting: *Deleted

## 2021-11-11 NOTE — Telephone Encounter (Signed)
?  Follow up Call- ? ? ?  11/09/2021  ? 12:36 PM  ?Call back number  ?Post procedure Call Back phone  # [phone]  ?Permission to leave phone message Yes  ?  ? ?Patient questions: ? ?Do you have a fever, pain , or abdominal swelling? No. ?Pain Score  0 * ? ?Have you tolerated food without any problems? Yes.   ? ?Have you been able to return to your normal activities? Yes.   ? ?Do you have any questions about your discharge instructions: ?Diet   No. ?Medications  No. ?Follow up visit  No. ? ?Do you have questions or concerns about your Care? No. ? ?Actions: ?* If pain score is 4 or above: ?No action needed, pain <4. ? ? ?

## 2021-11-19 ENCOUNTER — Other Ambulatory Visit: Payer: Self-pay

## 2021-11-19 MED ORDER — PANTOPRAZOLE SODIUM 40 MG PO TBEC: 40.0000 mg | DELAYED_RELEASE_TABLET | Freq: Two times a day (BID) | ORAL | 0 refills | Status: AC

## 2021-12-08 ENCOUNTER — Encounter: Payer: Commercial Managed Care - PPO | Admitting: Gastroenterology

## 2021-12-22 ENCOUNTER — Ambulatory Visit: Payer: Commercial Managed Care - PPO | Admitting: Gastroenterology

## 2021-12-22 NOTE — Progress Notes (Deleted)
     [facility] Progress Note  Chief Complaint: ***  Subjective  History: Jose Ramos was last seen for upper endoscopy on 11/09/2021 for upper abdominal pain, nausea and vomiting heartburn and dysphagia.  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  After pathology results, recommendation was to increase his pantoprazole from 40 mg once daily to twice daily. ***  ROS: Cardiovascular:  no chest pain Respiratory: no dyspnea  The patient's Past Medical, Family and Social History were reviewed and are on file in the EMR.  Objective:  Med list reviewed  Current Outpatient Medications:    citalopram (CELEXA) 40 MG tablet, Take 40 mg by mouth daily., Disp: , Rfl:    OLANZapine (ZYPREXA) 7.5 MG tablet, Take 7.5 mg by mouth at bedtime., Disp: , Rfl:    ondansetron (ZOFRAN) 4 MG tablet, Take 1 tablet (4 mg total) by mouth every 6 (six) hours as needed for nausea or vomiting., Disp: 60 tablet, Rfl: 0   pantoprazole (PROTONIX) 40 MG tablet, Take 1 tablet (40 mg total) by mouth 2 (two) times daily., Disp: 180 tablet, Rfl: 0   Vital signs in last 24 hrs: There were no vitals filed for this visit. Wt Readings from Last 3 Encounters:  11/09/21 244 lb (110.7 kg)  11/05/21 244 lb 12.8 oz (111 kg)  08/16/21 240 lb (108.9 kg)    Physical Exam  *** HEENT: sclera anicteric, oral mucosa moist without lesions Neck: supple, no thyromegaly, JVD or lymphadenopathy Cardiac: ***,  no peripheral edema Pulm: clear to auscultation bilaterally, normal RR and effort noted Abdomen: soft, *** tenderness, with active bowel sounds. No guarding or palpable hepatosplenomegaly. Skin; warm and dry, no jaundice or rash  Labs:   ___________________________________________ Radiologic studies:   ____________________________________________ Other: 1. Surgical [P], duodenal- abdominal pain, nausea and vomiting - DUODENAL MUCOSA  WITH NO SPECIFIC HISTOPATHOLOGIC CHANGES - NEGATIVE FOR INCREASED INTRAEPITHELIAL LYMPHOCYTES OR VILLOUS ARCHITECTURAL CHANGES 2. Surgical [P], duodenal polyp - GASTRIC HETEROTOPIA 3. Surgical [P], random gastric- abdominal pain, nausea and vomiting - GASTRIC ANTRAL AND OXYNTIC MUCOSA WITH NO SPECIFIC HISTOPATHOLOGIC CHANGES - HELICOBACTER PYLORI-LIKE ORGANISMS ARE NOT IDENTIFIED ON ROUTINE H&E STAIN 4. 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 SEE NOTE 5. 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 SEE NOTE Diagnosis Note 4. and 5. Differential diagnosis includes both reflux and eosinophilic esophagitis but the latter is favored.  _____________________________________________ Assessment & Plan  Assessment: No diagnosis found.    Plan:   *** minutes were spent on this encounter (including chart review, history/exam, counseling/coordination of care, and documentation) > 50% of that time was spent on counseling and coordination of care.   Jose Ramos

## 2021-12-27 ENCOUNTER — Encounter (HOSPITAL_BASED_OUTPATIENT_CLINIC_OR_DEPARTMENT_OTHER): Payer: Self-pay | Admitting: Pediatrics

## 2021-12-27 ENCOUNTER — Emergency Department (HOSPITAL_BASED_OUTPATIENT_CLINIC_OR_DEPARTMENT_OTHER): Payer: Commercial Managed Care - PPO

## 2021-12-27 ENCOUNTER — Other Ambulatory Visit: Payer: Self-pay

## 2021-12-27 ENCOUNTER — Emergency Department (HOSPITAL_BASED_OUTPATIENT_CLINIC_OR_DEPARTMENT_OTHER)
Admission: EM | Admit: 2021-12-27 | Discharge: 2021-12-27 | Disposition: A | Discharge status: Home / Self Care | Payer: Commercial Managed Care - PPO | Attending: Student | Admitting: Student

## 2021-12-27 DIAGNOSIS — M25511 Pain in right shoulder: Secondary | ICD-10-CM | POA: Diagnosis present

## 2021-12-27 DIAGNOSIS — M7521 Bicipital tendinitis, right shoulder: Secondary | ICD-10-CM | POA: Insufficient documentation

## 2021-12-27 MED ORDER — KETOROLAC TROMETHAMINE 15 MG/ML IJ SOLN: 15.0000 mg | Freq: Once | INTRAMUSCULAR | Status: AC

## 2021-12-27 MED ORDER — NAPROXEN 375 MG PO TABS: 375.0000 mg | ORAL_TABLET | Freq: Two times a day (BID) | ORAL | 0 refills | Status: AC

## 2021-12-27 MED ORDER — KETOROLAC TROMETHAMINE 15 MG/ML IJ SOLN
INTRAMUSCULAR | Status: AC
  Administered 2021-12-27: 15 mg via INTRAMUSCULAR
  Filled 2021-12-27: qty 1

## 2021-12-27 NOTE — ED Provider Notes (Signed)
[facility] EMERGENCY DEPARTMENT Provider Note   CSN: [id number] Arrival date & time: 12/27/21  0944     History Chief Complaint  Patient presents with   Arm Pain    Jose Ramos is a 41 y.o. male who presents to the emergency department with a 22-month history of right frontal shoulder pain.  Patient works at [employer] and does a lot of repetitive movements.  Today, he was moving some tires and he felt a sharp sensation in the front part of the shoulder and has been having constant pain since.  Denies any other injury or trauma to the area.  No numbness or focal weakness to the arm.   Arm Pain      Home Medications Prior to Admission medications   Medication Sig Start Date End Date Taking? Authorizing Provider  naproxen (NAPROSYN) 375 MG tablet Take 1 tablet (375 mg total) by mouth 2 (two) times daily. 12/27/21  Yes Meredeth Ide, Caleesi Kohl M, PA-C  citalopram (CELEXA) 40 MG tablet Take 40 mg by mouth daily. 10/04/21   [provider]  OLANZapine (ZYPREXA) 7.5 MG tablet Take 7.5 mg by mouth at bedtime. 08/16/21   [provider]  ondansetron (ZOFRAN) 4 MG tablet Take 1 tablet (4 mg total) by mouth every 6 (six) hours as needed for nausea or vomiting. 11/05/21   Danis, Andreas Blower, MD  pantoprazole (PROTONIX) 40 MG tablet Take 1 tablet (40 mg total) by mouth 2 (two) times daily. 11/19/21   Sherrilyn Rist, MD      Allergies    Lisinopril    Review of Systems   Review of Systems  All other systems reviewed and are negative.  Physical Exam Updated Vital Signs BP (!) 147/94 (BP Location: Left Arm)   Pulse 67   Temp 98 F (36.7 C) (Oral)   Resp 18   Ht 5\' 10"  (1.778 m)   Wt 106.6 kg   SpO2 98%   BMI 33.72 kg/m  Physical Exam Vitals and nursing note reviewed.  Constitutional:      Appearance: Normal appearance.  HENT:     Head: Normocephalic and atraumatic.  Eyes:     General:        Right eye: No discharge.        Left eye: No discharge.      Conjunctiva/sclera: Conjunctivae normal.  Pulmonary:     Effort: Pulmonary effort is normal.  Musculoskeletal:     Comments: There is point tenderness along the bicipital groove on the right.  Patient does have limited range of motion secondary to pain.  He is neurovascular intact distal to the shoulder.  Does have a 2+ radial pulse in the right wrist with good cap refill to fingers.  Skin:    General: Skin is warm and dry.     Findings: No rash.  Neurological:     General: No focal deficit present.     Mental Status: He is alert.  Psychiatric:        Mood and Affect: Mood normal.        Behavior: Behavior normal.    ED Results / Procedures / Treatments   Labs (all labs ordered are listed, but only abnormal results are displayed) Labs Reviewed - No data to display  EKG None  Radiology DG Shoulder Right  Result Date: 12/27/2021 CLINICAL DATA:  pain EXAM: RIGHT SHOULDER - 2+ VIEW COMPARISON:  None Available. FINDINGS: There is no evidence of fracture or  dislocation. There is no evidence of arthropathy or other focal bone abnormality. Soft tissues are unremarkable. IMPRESSION: Negative. Electronically Signed   By: Marjo Bicker M.D.   On: 12/27/2021 11:01    Procedures Procedures    Medications Ordered in ED Medications  ketorolac (TORADOL) 15 MG/ML injection 15 mg (has no administration in time range)  ketorolac (TORADOL) 15 MG/ML injection (has no administration in time range)    ED Course/ Medical Decision Making/ A&P                           Medical Decision Making Jose Ramos is a 41 y.o. male who presents to the emerged part with right shoulder pain.  Given the findings on physical exam and the repetitive motion at his job I am suspicious for bicipital tendinitis.  He is locally tender in the bicipital groove on the right.  I will give him a shot of Toradol and an arm sling for comfort and have him follow-up with orthopedics.  We will also provide him with some  anti-inflammatories.   Amount and/or Complexity of Data Reviewed Radiology: ordered.  Risk Prescription drug management.    Final Clinical Impression(s) / ED Diagnoses Final diagnoses:  Biceps tendinitis of right upper extremity    Rx / DC Orders ED Discharge Orders          Ordered    naproxen (NAPROSYN) 375 MG tablet  2 times daily        12/27/21 1233              Honor Loh [place], [place] 12/27/21 1235    Kommor, Wyn Forster, MD 12/27/21 1610

## 2021-12-27 NOTE — ED Triage Notes (Signed)
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

## 2021-12-27 NOTE — Discharge Instructions (Addendum)
Please take naproxen 2 times daily for pain.  Use sling for comfort.  Please stop taking naproxen if you experience black stools or start vomiting blood.  I would like for you to follow-up with your PCP or with orthopedics for further evaluation. Please return to the ED for further evaluation.

## 2023-08-19 IMAGING — CT CT CTA ABD/PEL W/CM AND/OR W/O CM
3 of 9 series · 11 of 46 positions shown, 17 images · IV contrast (Omnipaque)
Comparison: None.

CLINICAL DATA: Lower GI bleeding, right lower quadrant pain

EXAM:
CTA ABDOMEN AND PELVIS WITHOUT AND WITH CONTRAST
TECHNIQUE: Multidetector CT imaging of the abdomen and pelvis was performed
using the standard protocol during bolus administration of
intravenous contrast. Multiplanar reconstructed images and MIPs were
obtained and reviewed to evaluate the vascular anatomy.

[Series 3: axial venous · axial · portal-venous · 0.98mm/px · z∈[-454,-94]mm · 7 of 98 slices shown, 12 images]
[im 13/98  soft-tissue]
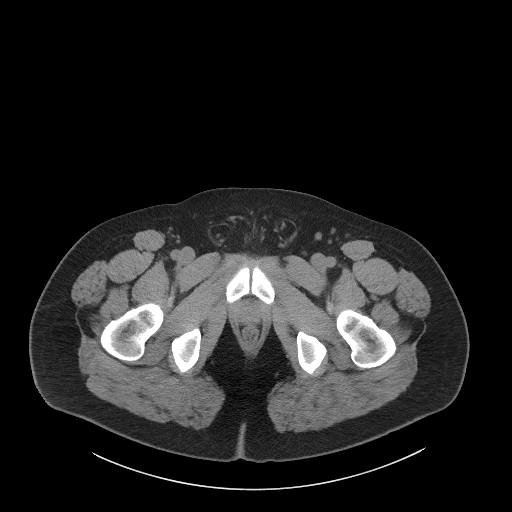
[im 13/98  bone]
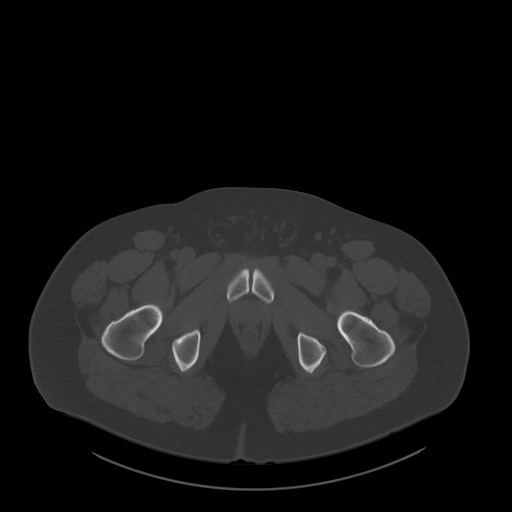
[im 25/98  soft-tissue]
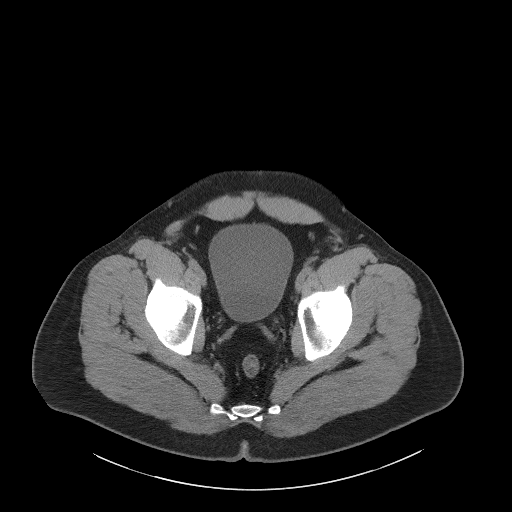
[im 37/98  soft-tissue]
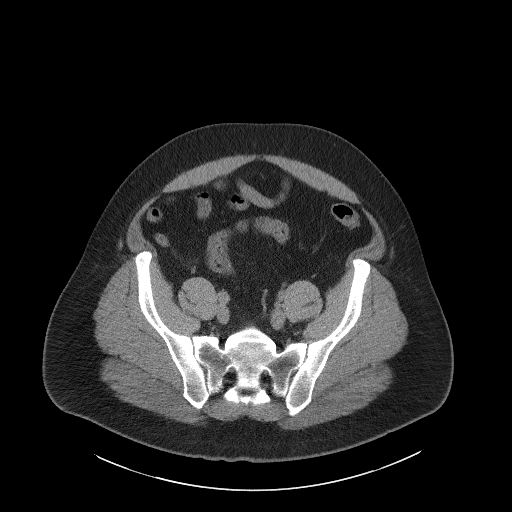
[im 49/98  soft-tissue]
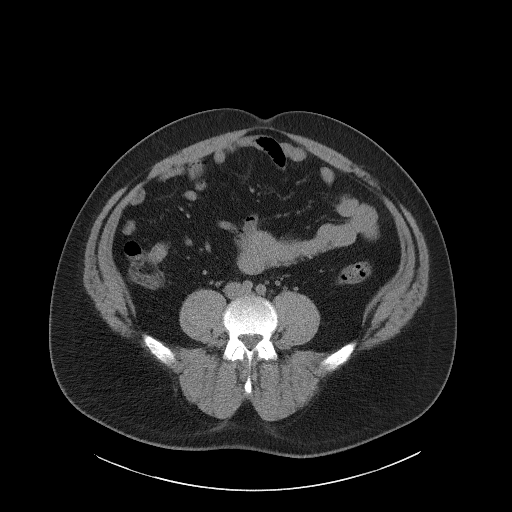
[im 49/98  lung]
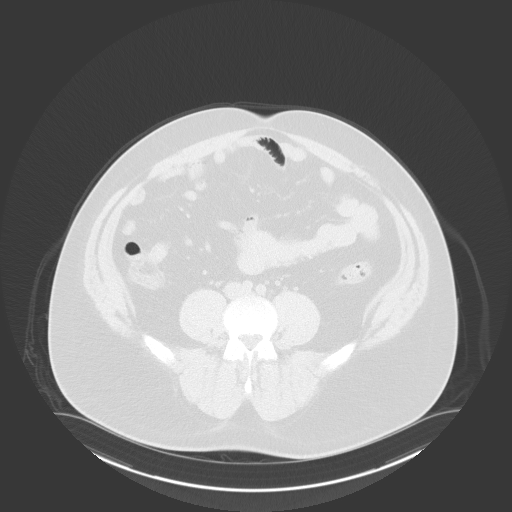
[im 61/98  soft-tissue]
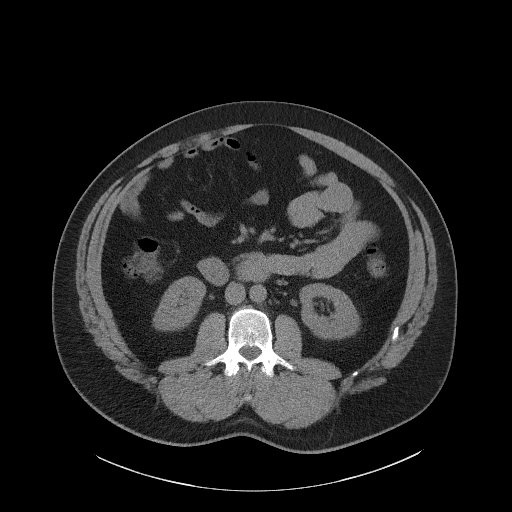
[im 61/98  lung]
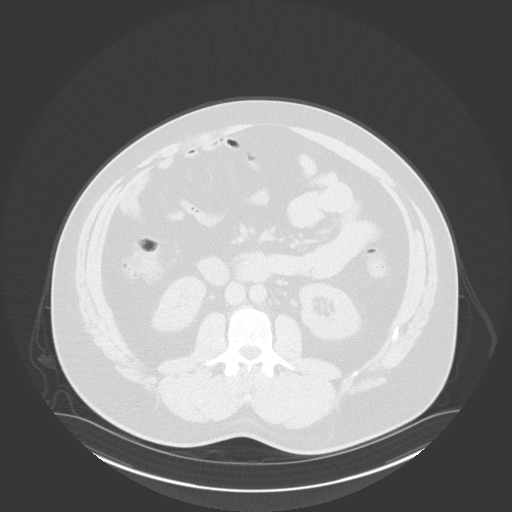
[im 73/98  soft-tissue]
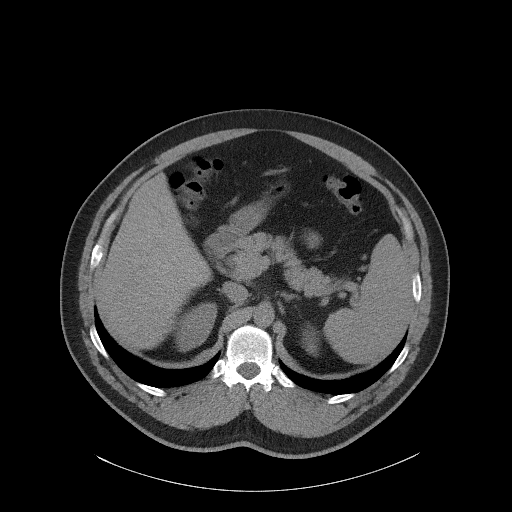
[im 73/98  lung]
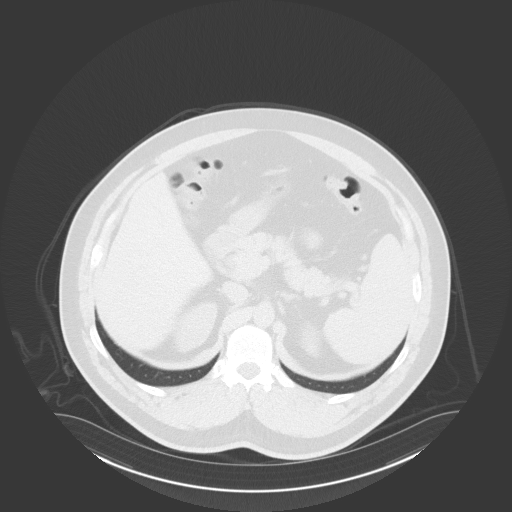
[im 85/98  soft-tissue]
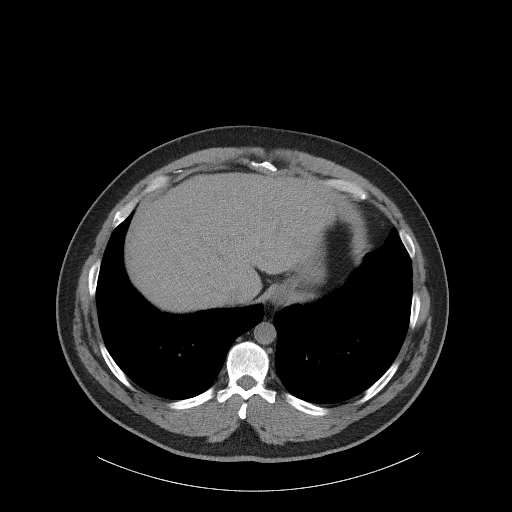
[im 85/98  lung]
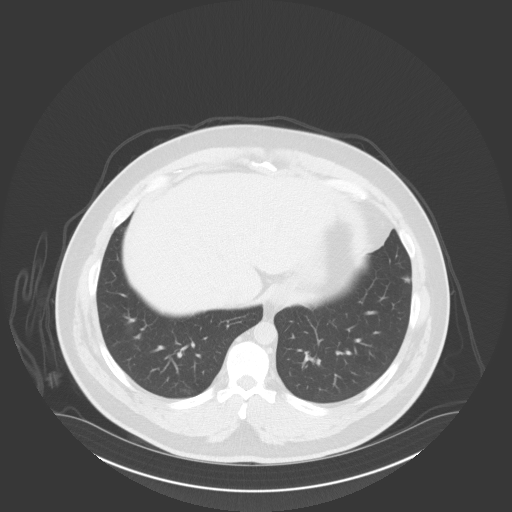

[Series 6: axial arterial · axial · arterial · 0.98mm/px · z∈[-479,-404]mm · 2 of 163 slices shown]
[im 13/163  soft-tissue]
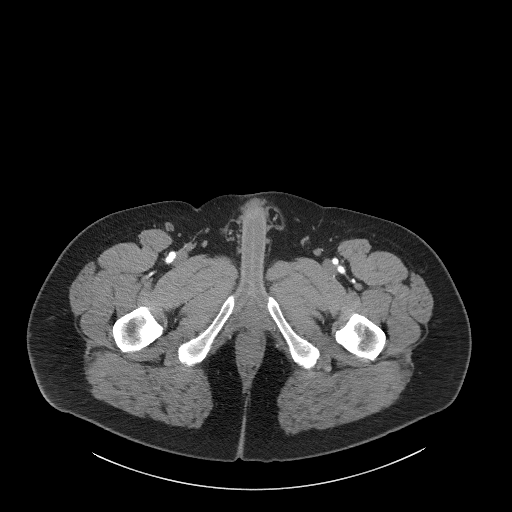
[im 38/163  soft-tissue]
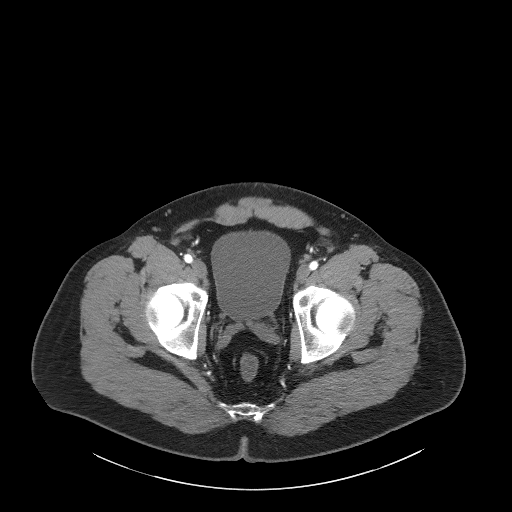

[Series 8: coronals · coronal · 0.81mm/px · 2 of 117 slices shown, 3 images]
[im 39/117  soft-tissue]
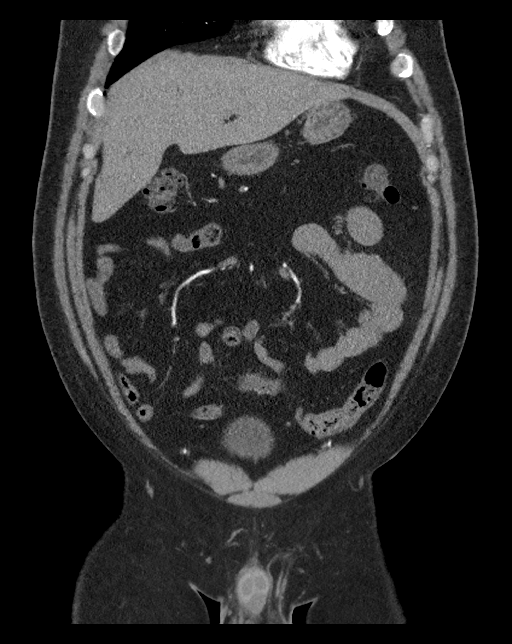
[im 39/117  bone]
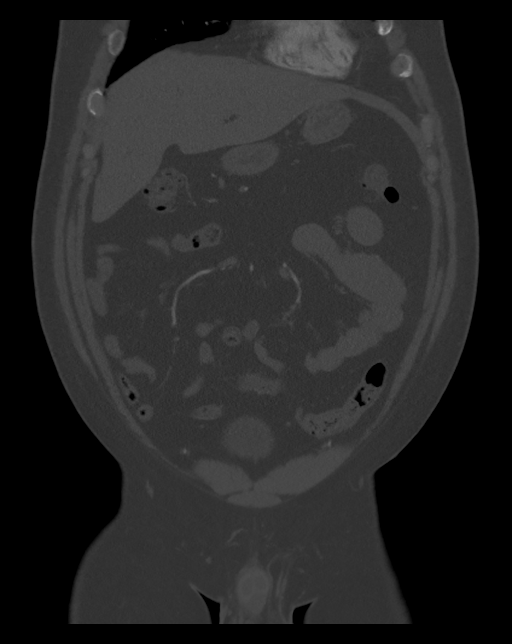
[im 78/117  soft-tissue]
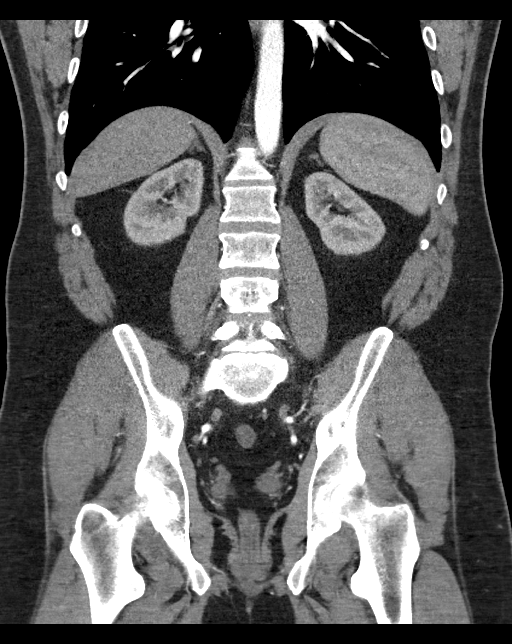

[11 of 46 positions shown; findings below may reference images not displayed]

RADIATION DOSE REDUCTION: This exam was performed according to the
departmental dose-optimization program which includes automated
exposure control, adjustment of the mA and/or kV according to
patient size and/or use of iterative reconstruction technique.

CONTRAST:  100mL OMNIPAQUE IOHEXOL 350 MG/ML SOLN
FINDINGS: VASCULAR

Aorta: Normal caliber aorta without aneurysm, dissection, vasculitis
or significant stenosis.

Celiac: Patent without evidence of aneurysm, dissection, vasculitis
or significant stenosis.

SMA: Patent without evidence of aneurysm, dissection, vasculitis or
significant stenosis.

Renals: Duplicated right, superior dominant, both patent. Duplicated
left, Co-dominant, both patent.

IMA: Patent without evidence of aneurysm, dissection, vasculitis or
significant stenosis.

Inflow: Patent without evidence of aneurysm, dissection, vasculitis
or significant stenosis.

Proximal Outflow: Bilateral common femoral and visualized portions
of the superficial and profunda femoral arteries are patent without
evidence of aneurysm, dissection, vasculitis or significant
stenosis.

Veins: No obvious venous abnormality within the limitations of this
arterial phase study.

Review of the MIP images confirms the above findings.

NON-VASCULAR

Lower chest: No pleural or pericardial effusion. Visualized lung
bases clear.

Hepatobiliary: No focal liver abnormality is seen. Status post
cholecystectomy. No biliary dilatation.

Pancreas: Unremarkable. No pancreatic ductal dilatation or
surrounding inflammatory changes.

Spleen: Normal in size without focal abnormality.

Adrenals/Urinary Tract: Adrenal glands are unremarkable. Kidneys are
normal, without renal calculi, focal lesion, or hydronephrosis.
Bladder is unremarkable.

Stomach/Bowel: No active extravasation into the lumen identified.
Stomach is nondistended, unremarkable. Small bowel decompressed.
Normal appendix. The colon is nondilated. A few scattered
diverticula from the splenic flexure and proximal descending
segment, without adjacent inflammatory change.

Lymphatic: No abdominal or pelvic adenopathy.

Reproductive: Prostate is unremarkable.

Other: No ascites.  No free air.

Musculoskeletal: Early degenerative disc disease L5-S1. No fracture
or worrisome bone lesion.
IMPRESSION: 1. No active extravasation into the bowel lumen.
2. Diverticulosis involving splenic flexure and proximal descending
colon.

## 2023-08-29 ENCOUNTER — Emergency Department (HOSPITAL_BASED_OUTPATIENT_CLINIC_OR_DEPARTMENT_OTHER): Payer: Medicaid Other

## 2023-08-29 ENCOUNTER — Ambulatory Visit (HOSPITAL_BASED_OUTPATIENT_CLINIC_OR_DEPARTMENT_OTHER): Admission: RE | Admit: 2023-08-29 | Payer: Medicaid Other | Source: Ambulatory Visit

## 2023-08-29 ENCOUNTER — Emergency Department (HOSPITAL_BASED_OUTPATIENT_CLINIC_OR_DEPARTMENT_OTHER)
Admission: EM | Admit: 2023-08-29 | Discharge: 2023-08-29 | Disposition: A | Discharge status: Home / Self Care | Payer: Medicaid Other | Attending: Emergency Medicine | Admitting: Emergency Medicine

## 2023-08-29 ENCOUNTER — Other Ambulatory Visit: Payer: Self-pay

## 2023-08-29 ENCOUNTER — Encounter (HOSPITAL_BASED_OUTPATIENT_CLINIC_OR_DEPARTMENT_OTHER): Payer: Self-pay | Admitting: Emergency Medicine

## 2023-08-29 DIAGNOSIS — Z5321 Procedure and treatment not carried out due to patient leaving prior to being seen by health care provider: Secondary | ICD-10-CM | POA: Diagnosis not present

## 2023-08-29 DIAGNOSIS — R042 Hemoptysis: Secondary | ICD-10-CM | POA: Insufficient documentation

## 2023-08-29 NOTE — ED Triage Notes (Signed)
Pt reports he thinks he had the flu last week but was not tested. He has had a constant cough for a week and now he reports is coughing up blood. Pt brought pictures which showed scant amounts of blood.

## 2023-08-29 NOTE — ED Notes (Signed)
Called x 2 for imaging and x 1 by triage

## 2023-09-04 ENCOUNTER — Emergency Department (HOSPITAL_BASED_OUTPATIENT_CLINIC_OR_DEPARTMENT_OTHER): Payer: Medicaid Other

## 2023-09-04 ENCOUNTER — Encounter (HOSPITAL_BASED_OUTPATIENT_CLINIC_OR_DEPARTMENT_OTHER): Payer: Self-pay

## 2023-09-04 ENCOUNTER — Emergency Department (HOSPITAL_BASED_OUTPATIENT_CLINIC_OR_DEPARTMENT_OTHER)
Admission: EM | Admit: 2023-09-04 | Discharge: 2023-09-04 | Disposition: A | Discharge status: Home / Self Care | Payer: Medicaid Other | Attending: Emergency Medicine | Admitting: Emergency Medicine

## 2023-09-04 ENCOUNTER — Other Ambulatory Visit: Payer: Self-pay

## 2023-09-04 DIAGNOSIS — R079 Chest pain, unspecified: Secondary | ICD-10-CM | POA: Insufficient documentation

## 2023-09-04 DIAGNOSIS — J069 Acute upper respiratory infection, unspecified: Secondary | ICD-10-CM | POA: Diagnosis not present

## 2023-09-04 DIAGNOSIS — K921 Melena: Secondary | ICD-10-CM | POA: Diagnosis present

## 2023-09-04 LAB — CBC WITH DIFFERENTIAL/PLATELET
Abs Immature Granulocytes: 0.02 10*3/uL (ref 0.00–0.07)
Basophils Absolute: 0 10*3/uL (ref 0.0–0.1)
Basophils Relative: 1 %
Eosinophils Absolute: 0.1 10*3/uL (ref 0.0–0.5)
Eosinophils Relative: 1 %
HCT: 42.8 % (ref 39.0–52.0)
Hemoglobin: 14.8 g/dL (ref 13.0–17.0)
Immature Granulocytes: 0 %
Lymphocytes Relative: 34 %
Lymphs Abs: 2.1 10*3/uL (ref 0.7–4.0)
MCH: 27.9 pg (ref 26.0–34.0)
MCHC: 34.6 g/dL (ref 30.0–36.0)
MCV: 80.8 fL (ref 80.0–100.0)
Monocytes Absolute: 0.6 10*3/uL (ref 0.1–1.0)
Monocytes Relative: 10 %
Neutro Abs: 3.3 10*3/uL (ref 1.7–7.7)
Neutrophils Relative %: 54 %
Platelets: 306 10*3/uL (ref 150–400)
RBC: 5.3 MIL/uL (ref 4.22–5.81)
RDW: 13.2 % (ref 11.5–15.5)
WBC: 6.1 10*3/uL (ref 4.0–10.5)
nRBC: 0 % (ref 0.0–0.2)

## 2023-09-04 LAB — BASIC METABOLIC PANEL
Anion gap: 8 (ref 5–15)
BUN: 16 mg/dL (ref 6–20)
CO2: 25 mmol/L (ref 22–32)
Calcium: 8.8 mg/dL — ABNORMAL LOW (ref 8.9–10.3)
Chloride: 103 mmol/L (ref 98–111)
Creatinine, Ser: 1.4 mg/dL — ABNORMAL HIGH (ref 0.61–1.24)
GFR, Estimated: >60 mL/min (ref >60)
Glucose, Bld: 91 mg/dL (ref 70–99)
Potassium: 4.1 mmol/L (ref 3.5–5.1)
Sodium: 136 mmol/L (ref 135–145)

## 2023-09-04 MED ORDER — PREPARATION H 0.25-50 % EX GEL: 1.0000 g | Freq: Two times a day (BID) | CUTANEOUS | 0 refills | Status: AC

## 2023-09-04 NOTE — ED Provider Notes (Signed)
 [facility] EMERGENCY DEPARTMENT AT [facility] Provider Note   CSN: [id number] Arrival date & time: 09/04/23  1054     History  Chief Complaint  Patient presents with   Chest Pain   Blood In Stools    Loren Aprahamian is a 43 y.o. male.  43 yo M with a chief complaints of cough congestion going on for couple weeks.  Was in close contact with someone who had the flu.  Ended up having a bit of pinkish sputum and had checked into the ER but due to the prolonged wait left.  Went to urgent care and was diagnosed with pneumonia and was started on Augmentin and doxycycline.  He is just about finished these antibiotics but feels like he is still coughing.  Does feel a bit better.  Has been having some diarrhea and noticed that he had some blood when he wiped with toilet paper and then had some blood in the bowl this morning and so came here for evaluation.  He is on anticoagulation secondary to PE.   Chest Pain      Home Medications Prior to Admission medications   Medication Sig Start Date End Date Taking? Authorizing Provider  Phenylephrine-Witch Hazel (PREPARATION H) 0.25-50 % GEL Apply 1 g topically in the morning and at bedtime. Apply after BM 09/04/23  Yes Albertus Hughs, DO  citalopram (CELEXA) 40 MG tablet Take 40 mg by mouth daily. 10/04/21   [provider]  naproxen  (NAPROSYN ) 375 MG tablet Take 1 tablet (375 mg total) by mouth 2 (two) times daily. 12/27/21   Angelyn Kennel M, PA-C  OLANZapine (ZYPREXA) 7.5 MG tablet Take 7.5 mg by mouth at bedtime. 08/16/21   [provider]  ondansetron  (ZOFRAN ) 4 MG tablet Take 1 tablet (4 mg total) by mouth every 6 (six) hours as needed for nausea or vomiting. 11/05/21   Albertina Hugger, MD  pantoprazole  (PROTONIX ) 40 MG tablet Take 1 tablet (40 mg total) by mouth 2 (two) times daily. 11/19/21   Albertina Hugger, MD      Allergies    Lisinopril    Review of Systems   Review of Systems  Cardiovascular:  Positive  for chest pain.    Physical Exam Updated Vital Signs BP 123/78   Pulse 64   Temp 98.2 F (36.8 C)   Resp 17   Ht 5\' 10"  (1.778 m)   Wt 104.3 kg   SpO2 96%   BMI 33.00 kg/m  Physical Exam Vitals and nursing note reviewed.  Constitutional:      Appearance: He is well-developed.  HENT:     Head: Normocephalic and atraumatic.  Eyes:     Pupils: Pupils are equal, round, and reactive to light.  Neck:     Vascular: No JVD.  Cardiovascular:     Rate and Rhythm: Normal rate and regular rhythm.     Heart sounds: No murmur heard.    No friction rub. No gallop.  Pulmonary:     Effort: No respiratory distress.     Breath sounds: No wheezing.  Abdominal:     General: There is no distension.     Tenderness: There is no abdominal tenderness. There is no guarding or rebound.  Genitourinary:    Comments: No obvious external hemorrhoids.  DRE deferred Musculoskeletal:        General: Normal range of motion.     Cervical back: Normal range of motion and neck supple.  Skin:  Coloration: Skin is not pale.     Findings: No rash.  Neurological:     Mental Status: He is alert and oriented to person, place, and time.  Psychiatric:        Behavior: Behavior normal.     ED Results / Procedures / Treatments   Labs (all labs ordered are listed, but only abnormal results are displayed) Labs Reviewed  BASIC METABOLIC PANEL - Abnormal; Notable for the following components:      Result Value   Creatinine, Ser 1.40 (*)    Calcium 8.8 (*)    All other components within normal limits  CBC WITH DIFFERENTIAL/PLATELET    EKG EKG Interpretation Date/Time:  Monday September 04 2023 11:16:34 EST Ventricular Rate:  63 PR Interval:  164 QRS Duration:  97 QT Interval:  439 QTC Calculation: 450 R Axis:   111  Text Interpretation: Sinus rhythm Left posterior fascicular block Abnormal R-wave progression, late transition Baseline wander in lead(s) V1 TECHNICALLY DIFFICULT No old tracing to  compare Confirmed by Albertus Hughs [phone]) on 09/04/2023 12:15:35 PM  Radiology DG Chest Port 1 View Result Date: 09/04/2023 CLINICAL DATA:  Cough. EXAM: PORTABLE CHEST 1 VIEW COMPARISON:  Chest radiograph dated 03/24/2023. FINDINGS: The heart size and mediastinal contours are within normal limits. No focal consolidation, pleural effusion, or pneumothorax. No acute osseous abnormality. IMPRESSION: No acute cardiopulmonary findings. Electronically Signed   By: Mannie Seek M.D.   On: 09/04/2023 13:55    Procedures Procedures    Medications Ordered in ED Medications - No data to display  ED Course/ Medical Decision Making/ A&P                                 Medical Decision Making Amount and/or Complexity of Data Reviewed Labs: ordered. Radiology: ordered.  Risk OTC drugs.   43 yo M with a chief complaints of not feeling well.  Going on for a couple weeks.  Patient was in close contact with someone who had the flu and so I suspect he likely had the flu.  He was seen at urgent care and was diagnosed clinically with pneumonia and started on dual antibiotic therapy.  The patient has improved somewhat but is still coughing and so came here for evaluation.  He also had developed blood when he wiped with toilet paper and then started having grossly bloody stools this morning.  Will check the patient's CBC.  Chest x-ray.  Reassess.  No anemia, no significant electrolyte abnormalities.  Chest x-ray independently interpreted by me without focal infiltrate or pneumothorax.  I discussed results with patient.  Will have him follow-up as an outpatient.  The patients results and plan were reviewed and discussed.   Any x-rays performed were independently reviewed by myself.   Differential diagnosis were considered with the presenting HPI.  Medications - No data to display  Vitals:   09/04/23 1111 09/04/23 1111 09/04/23 1235  BP:  (!) 126/94 123/78  Pulse:  71 64  Resp:  18 17  Temp:   98.2 F (36.8 C)   SpO2:  97% 96%  Weight: 104.3 kg    Height: 5\' 10"  (1.778 m)      Final diagnoses:  Blood in stool  Upper respiratory tract infection, unspecified type    Admission/ observation were discussed with the admitting physician, patient and/or family and they are comfortable with the plan.  Final Clinical Impression(s) / ED Diagnoses Final diagnoses:  Blood in stool  Upper respiratory tract infection, unspecified type    Rx / DC Orders ED Discharge Orders          Ordered    Phenylephrine-Witch Hazel (PREPARATION H) 0.25-50 % GEL  2 times daily        09/04/23 1401              [place], DO 09/04/23 1416

## 2023-09-04 NOTE — ED Triage Notes (Signed)
 C/o being sick x 2 weeks. Came here on 2/4 but left AMA. States went to urgent care last week and dx with pneumonia. Taking augmentin but not improving. States BM is straight blood. Vomiting. On eliquis.

## 2023-09-04 NOTE — Discharge Instructions (Signed)
 Follow up with your family doc and GI doc in the office. Return for worsening bleeding

## 2023-12-30 IMAGING — CR DG SHOULDER 2+V*R*
3 series · 3 of 3 positions shown · non-contrast
Comparison: None Available.

CLINICAL DATA: pain

EXAM:
RIGHT SHOULDER - 2+ VIEW

[w shoulder grashey right]
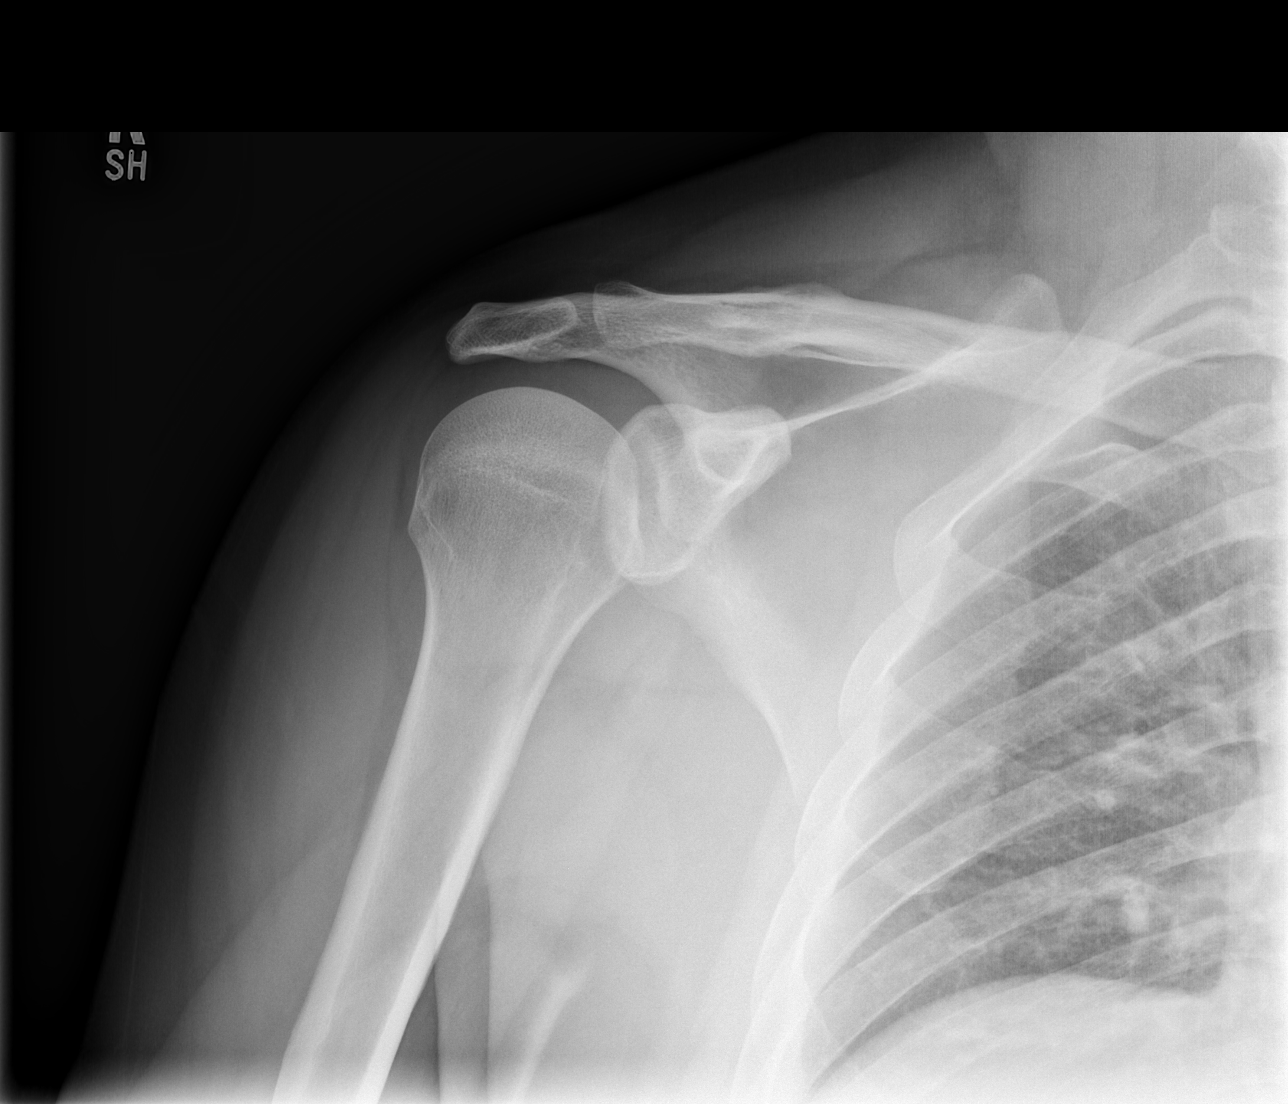

[w shoulder y view right]
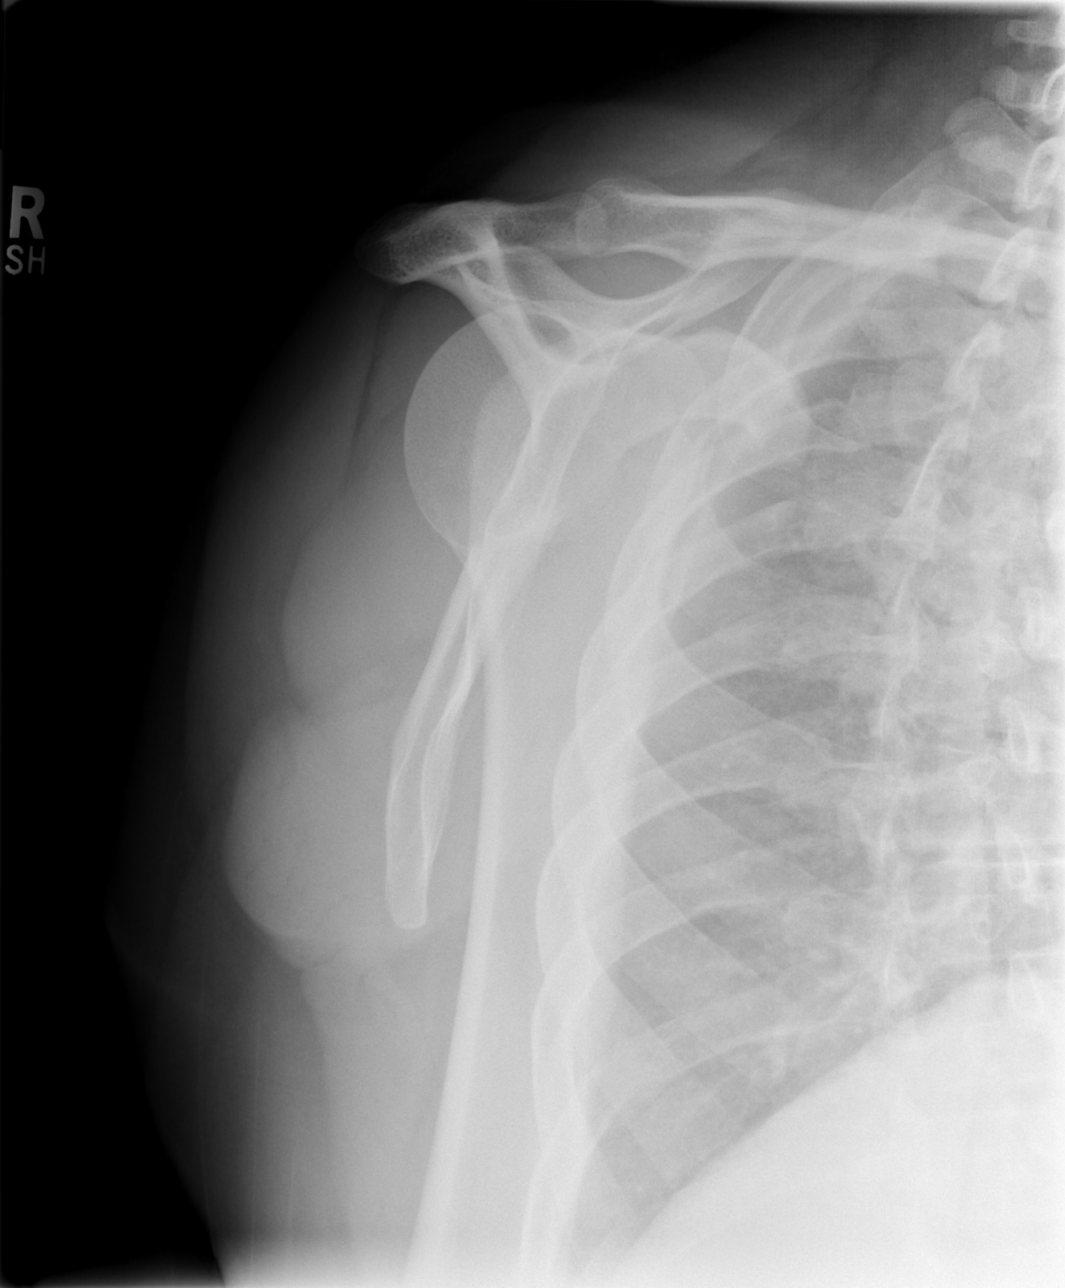

[x shoulder axillary right]
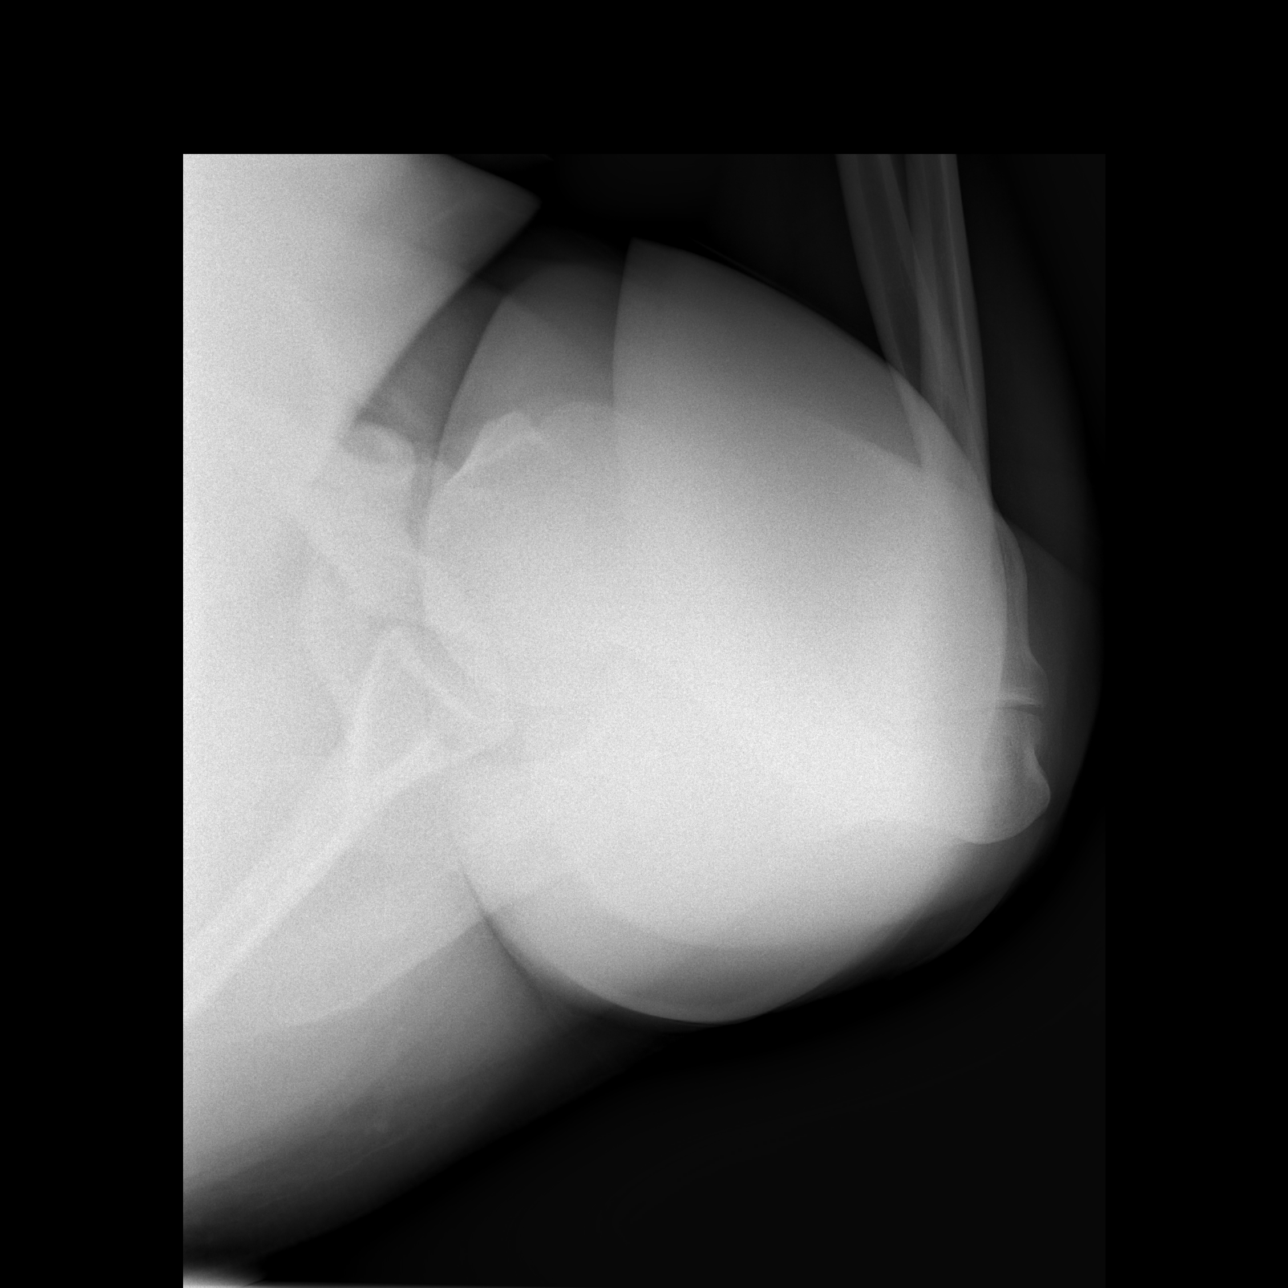

[3 of 3 positions shown; findings below may reference images not displayed]

FINDINGS: There is no evidence of fracture or dislocation. There is no
evidence of arthropathy or other focal bone abnormality. Soft
tissues are unremarkable.
IMPRESSION: Negative.
